# Patient Record
Sex: Female | Born: 1985 | Race: Black or African American | Hispanic: No | Marital: Single | Smoking: Never smoker
Health system: Southern US, Community
[De-identification: ages and names within clinical notes are randomized; demographics above are authoritative.]

## PROBLEM LIST (undated history)

## (undated) DIAGNOSIS — I10 Essential (primary) hypertension: Secondary | ICD-10-CM

---

## 2005-12-05 ENCOUNTER — Emergency Department (HOSPITAL_COMMUNITY): Admission: EM | Admit: 2005-12-05 | Discharge: 2005-12-06 | Payer: Self-pay | Admitting: Emergency Medicine

## 2011-10-06 ENCOUNTER — Encounter (HOSPITAL_COMMUNITY): Payer: Self-pay | Admitting: *Deleted

## 2011-10-06 ENCOUNTER — Emergency Department (HOSPITAL_COMMUNITY)
Admission: EM | Admit: 2011-10-06 | Discharge: 2011-10-07 | Disposition: A | Discharge status: Home / Self Care | Payer: Self-pay | Attending: Emergency Medicine | Admitting: Emergency Medicine

## 2011-10-06 DIAGNOSIS — M25569 Pain in unspecified knee: Secondary | ICD-10-CM | POA: Insufficient documentation

## 2011-10-06 NOTE — ED Notes (Addendum)
Pt states she feels like the bones in her knee are rubbing together x 2 days. Pt also wants blood sugar checked.

## 2011-10-07 MED ORDER — IBUPROFEN 600 MG PO TABS: 600.0000 mg | ORAL_TABLET | Freq: Three times a day (TID) | ORAL | Status: DC | PRN

## 2011-10-07 NOTE — ED Provider Notes (Signed)
History     CSN: [id number]  Arrival date & time 10/06/11  2202   First MD Initiated Contact with Patient 10/06/11 2359      Chief Complaint  Patient presents with  . Knee Pain     The history is provided by the patient.  the patient reports long-standing history of left knee pain.  She reports her pain in her left knee is becoming worse.  She reports after prolonged sitting or significant bending of her left knee she develops pain in her left knee.  She is able to ambulate on this.  She's had no recent trauma.  She denies fevers or chills.  She's had no swelling or erythema.  She denies weakness or numbness.  She previously was told that she had a meniscal injury after MRI which is unsure of shivers on orthopedic surgeon route she's not sure who ordered the MRI scan.  She was never given surgical options.  History reviewed. No pertinent past medical history.  History reviewed. No pertinent past surgical history.  History reviewed. No pertinent family history.  History  Substance Use Topics  . Smoking status: Never Smoker   . Smokeless tobacco: Not on file  . Alcohol Use: No    OB History    Grav Para Term Preterm Abortions TAB SAB Ect Mult Living                  Review of Systems  All other systems reviewed and are negative.    Allergies  Review of patient's allergies indicates no known allergies.  Home Medications   Current Outpatient Rx  Name Route Sig Dispense Refill  . IBUPROFEN 600 MG PO TABS Oral Take 1 tablet (600 mg total) by mouth every 8 (eight) hours as needed for pain. 15 tablet 0    BP 170/82  Pulse 89  Temp 98.8 F (37.1 C) (Oral)  Resp 20  Ht 5\' 5"  (1.651 m)  Wt 310 lb (140.615 kg)  BMI 51.59 kg/m2  SpO2 100%  LMP 09/16/2011  Physical Exam  Nursing note and vitals reviewed. Constitutional: She is oriented to person, place, and time. She appears well-developed and well-nourished. No distress.  HENT:  Head: Normocephalic and atraumatic.   Eyes: EOM are normal.  Neck: Normal range of motion.  Pulmonary/Chest: Effort normal.  Musculoskeletal:       Mild pain with range of motion of left knee.  Normal patella.  No joint line tenderness.  Normal pulses in her left foot.  No warmth swelling or erythema of her left knee.  Neurological: She is alert and oriented to person, place, and time.  Skin: Skin is warm and dry.  Psychiatric: She has a normal mood and affect. Judgment normal.    ED Course  Procedures (including critical care time)  Labs Reviewed - No data to display No results found.   1. Knee pain       MDM  This is likely related to an ongoing meniscal injury.  Orthopedic followup.  Symptomatic treatment.  No indication for x-ray.  No recent trauma.        Lyanne Co, MD 10/07/11 [phone]

## 2014-06-02 ENCOUNTER — Emergency Department (HOSPITAL_COMMUNITY)
Admission: EM | Admit: 2014-06-02 | Discharge: 2014-06-02 | Disposition: A | Discharge status: Home / Self Care | Payer: Self-pay | Attending: Emergency Medicine | Admitting: Emergency Medicine

## 2014-06-02 ENCOUNTER — Encounter (HOSPITAL_COMMUNITY): Payer: Self-pay | Admitting: Emergency Medicine

## 2014-06-02 ENCOUNTER — Emergency Department (HOSPITAL_COMMUNITY): Payer: Self-pay

## 2014-06-02 DIAGNOSIS — I10 Essential (primary) hypertension: Secondary | ICD-10-CM | POA: Insufficient documentation

## 2014-06-02 DIAGNOSIS — M25561 Pain in right knee: Secondary | ICD-10-CM | POA: Insufficient documentation

## 2014-06-02 HISTORY — DX: Essential (primary) hypertension: I10

## 2014-06-02 MED ORDER — MELOXICAM 15 MG PO TABS: 15.0000 mg | ORAL_TABLET | Freq: Every day | ORAL | Status: DC

## 2014-06-02 NOTE — ED Provider Notes (Signed)
CSN: [id number]     Arrival date & time 06/02/14  1047 History   This chart was scribed for non-physician practitioner, Emilia Beck, PA-C, working with Jerelyn Scott, MD, by Lionel December, ED Scribe. This patient was seen in room TR07C/TR07C and the patient's care was started at 12:36 PM.    Chief Complaint  Patient presents with  . Knee Pain     (Consider location/radiation/quality/duration/timing/severity/associated sxs/prior Treatment) The history is provided by the patient. No language interpreter was used.    HPI Comments: Natalie Reynolds is a 29 y.o. female who presents to the Emergency Department complaining of right knee pain onset yesterday when it suddenly locked up.  She currently feels like it is very stiff and cant move it easily.  Patient denies any recent injury and had a meniscal tear two years ago.  She has no other questions or concerns today.     Past Medical History  Diagnosis Date  . Hypertension    History reviewed. No pertinent past surgical history. No family history on file. History  Substance Use Topics  . Smoking status: Never Smoker   . Smokeless tobacco: Not on file  . Alcohol Use: No   OB History    No data available     Review of Systems  Constitutional: Negative for fever and chills.  Gastrointestinal: Negative for nausea and vomiting.  Musculoskeletal: Positive for arthralgias. Negative for neck pain and neck stiffness.  All other systems reviewed and are negative.     Allergies  Review of patient's allergies indicates no known allergies.  Home Medications   Prior to Admission medications   Medication Sig Start Date End Date Taking? Authorizing Provider  ibuprofen (ADVIL,MOTRIN) 600 MG tablet Take 1 tablet (600 mg total) by mouth every 8 (eight) hours as needed for pain. 10/07/11   Azalia Bilis, MD   BP 137/96 mmHg  Pulse 85  Temp(Src) 98.8 F (37.1 C) (Oral)  Resp 18  Ht  (1.651 m)  Wt 310 lb (140.615 kg)  BMI 51.59  kg/m2  SpO2 98% Physical Exam  Constitutional: She is oriented to person, place, and time. She appears well-developed and well-nourished. No distress.  HENT:  Head: Normocephalic and atraumatic.  Eyes: Conjunctivae and EOM are normal.  Neck: Neck supple.  Cardiovascular: Normal rate and regular rhythm.  Exam reveals no gallop and no friction rub.   No murmur heard. Pulmonary/Chest: Effort normal and breath sounds normal. No respiratory distress. She has no wheezes. She has no rales. She exhibits no tenderness.  Abdominal: Soft. There is no tenderness.  Musculoskeletal:  Limited flexion of right knee due to pain. Mild bilateral right knee tenderness to palpation. No obvious deformity.   Neurological: She is alert and oriented to person, place, and time.  Speech is goal-oriented. Moves limbs without ataxia.   Skin: Skin is warm and dry.  Psychiatric: She has a normal mood and affect. Her behavior is normal.  Nursing note and vitals reviewed.   ED Course  Procedures (including critical care time) DIAGNOSTIC STUDIES: Oxygen Saturation is 98% on RA, normal by my interpretation.    COORDINATION OF CARE: 12:39 PM Discussed treatment plan with patient at beside, the patient agrees with the plan and has no further questions at this time.   Labs Review Labs Reviewed - No data to display  Imaging Review Dg Knee Complete 4 Views Right  06/02/2014   CLINICAL DATA:  Medial and lateral knee pain. Unable to bear weight.  EXAM:  RIGHT KNEE - COMPLETE 4+ VIEW  COMPARISON:  None.  FINDINGS: Degenerative changes are present within the lateral and patellofemoral compartments of the knee. Joint spaces are preserved. There is no significant effusion. No acute fracture is present.  IMPRESSION: 1. Degenerative changes of the right knee without an acute abnormality.   Electronically Signed   By: Marin Robertshristopher  Mattern M.D.   On: 06/02/2014 12:20     EKG Interpretation None      MDM   Final diagnoses:   Right knee pain   Patient's knee xray unremarkable for acute changes. Vitals stable and patient afebrile. Patient will be referred to Orthopedics for further evaluation due to knee "locking up." Patient will be discharged with Mobic.    I personally performed the services described in this documentation, which was scribed in my presence. The recorded information has been reviewed and is accurate.    Emilia BeckKaitlyn Carolan Avedisian, PA-C 06/02/14 1530  Jerelyn ScottMartha Linker, MD 06/02/14 1600

## 2014-06-02 NOTE — Discharge Instructions (Signed)
Take mobic as needed for pain. Rest, ice, and elevate your knee. Follow up with Dr. Magnus IvanBlackman for further evaluation.

## 2014-06-02 NOTE — ED Notes (Signed)
Patient states knee pain that started yesterday.   Patient states had torn meniscus 2 years ago without additional problems.   Patient denies injury.

## 2015-08-11 ENCOUNTER — Emergency Department (HOSPITAL_COMMUNITY): Payer: Managed Care, Other (non HMO)

## 2015-08-11 ENCOUNTER — Encounter (HOSPITAL_COMMUNITY): Payer: Self-pay | Admitting: *Deleted

## 2015-08-11 DIAGNOSIS — I10 Essential (primary) hypertension: Secondary | ICD-10-CM | POA: Insufficient documentation

## 2015-08-11 DIAGNOSIS — Z79899 Other long term (current) drug therapy: Secondary | ICD-10-CM | POA: Diagnosis not present

## 2015-08-11 DIAGNOSIS — J069 Acute upper respiratory infection, unspecified: Secondary | ICD-10-CM | POA: Diagnosis not present

## 2015-08-11 DIAGNOSIS — R05 Cough: Secondary | ICD-10-CM | POA: Diagnosis present

## 2015-08-11 DIAGNOSIS — J209 Acute bronchitis, unspecified: Secondary | ICD-10-CM | POA: Diagnosis not present

## 2015-08-11 NOTE — ED Triage Notes (Addendum)
Pt reports cough starting about a week ago. Pt was seen for similar symptoms, prescribed antibiotics, tessalon pills, home nebulizer to help with cough. Pt is not getting any relief from medication.

## 2015-08-12 ENCOUNTER — Emergency Department (HOSPITAL_COMMUNITY)
Admission: EM | Admit: 2015-08-12 | Discharge: 2015-08-12 | Disposition: A | Discharge status: Home / Self Care | Payer: Managed Care, Other (non HMO) | Attending: Emergency Medicine | Admitting: Emergency Medicine

## 2015-08-12 DIAGNOSIS — J069 Acute upper respiratory infection, unspecified: Secondary | ICD-10-CM

## 2015-08-12 DIAGNOSIS — B9789 Other viral agents as the cause of diseases classified elsewhere: Secondary | ICD-10-CM

## 2015-08-12 DIAGNOSIS — J209 Acute bronchitis, unspecified: Secondary | ICD-10-CM

## 2015-08-12 DIAGNOSIS — R05 Cough: Secondary | ICD-10-CM

## 2015-08-12 DIAGNOSIS — R059 Cough, unspecified: Secondary | ICD-10-CM

## 2015-08-12 MED ORDER — CETIRIZINE HCL 10 MG PO TABS: 10.0000 mg | ORAL_TABLET | Freq: Every day | ORAL | 1 refills | Status: DC

## 2015-08-12 MED ORDER — PREDNISONE 20 MG PO TABS
60.0000 mg | ORAL_TABLET | Freq: Once | ORAL | Status: AC
  Administered 2015-08-12: 60 mg via ORAL
  Filled 2015-08-12: qty 3

## 2015-08-12 MED ORDER — ALBUTEROL SULFATE (2.5 MG/3ML) 0.083% IN NEBU
5.0000 mg | INHALATION_SOLUTION | Freq: Once | RESPIRATORY_TRACT | Status: AC
  Administered 2015-08-12: 5 mg via RESPIRATORY_TRACT
  Filled 2015-08-12: qty 6

## 2015-08-12 MED ORDER — HYDROCODONE-HOMATROPINE 5-1.5 MG/5ML PO SYRP
5.0000 mL | ORAL_SOLUTION | ORAL | Status: DC | PRN
  Administered 2015-08-12: 5 mL via ORAL
  Filled 2015-08-12: qty 5

## 2015-08-12 MED ORDER — ALBUTEROL SULFATE HFA 108 (90 BASE) MCG/ACT IN AERS
2.0000 | INHALATION_SPRAY | RESPIRATORY_TRACT | Status: DC | PRN
  Administered 2015-08-12: 2 via RESPIRATORY_TRACT
  Filled 2015-08-12: qty 6.7

## 2015-08-12 MED ORDER — IPRATROPIUM BROMIDE 0.02 % IN SOLN
0.5000 mg | Freq: Once | RESPIRATORY_TRACT | Status: AC
  Administered 2015-08-12: 0.5 mg via RESPIRATORY_TRACT
  Filled 2015-08-12: qty 2.5

## 2015-08-12 MED ORDER — PREDNISONE 20 MG PO TABS: 40.0000 mg | ORAL_TABLET | Freq: Every day | ORAL | 0 refills | Status: DC

## 2015-08-12 MED ORDER — ALBUTEROL SULFATE (2.5 MG/3ML) 0.083% IN NEBU: 2.5000 mg | INHALATION_SOLUTION | RESPIRATORY_TRACT | 12 refills | Status: AC | PRN

## 2015-08-12 MED ORDER — AEROCHAMBER PLUS W/MASK MISC
1.0000 | Freq: Once | Status: AC
  Administered 2015-08-12: 1
  Filled 2015-08-12: qty 1

## 2015-08-12 NOTE — ED Provider Notes (Signed)
[facility]-EMERGENCY DEPT Provider Note   CSN: [id number] Arrival date & time: 08/11/15  2246  First Provider Contact:  First MD Initiated Contact with Patient 08/12/15 0023        History   Chief Complaint Chief Complaint  Patient presents with  . Cough    HPI Natalie Reynolds is a 30 y.o. female.  Natalie Reynolds is a 30 y.o. female  with a hx of HTN presents to the Emergency Department complaining of gradual, persistent, progressively worsening cough onset 5 days ago.  Patient reports she developed nasal congestion, sore throat, bilateral otalgia on 08/03/2015. She reports that she was seen in [facility] and given cough medication along with amoxicillin which she has been taking as directed. She reports after her cough and shortness of breath developed 5 days ago she was seen at [facility] where she was given an albuterol treatment in the emergency department and discharged home.  Patient reports she was not discharged home with albuterol. She has no history of asthma. Patient reports that her cough is frustrating but her chest is tight and she feel short of breath. She denies history of DVT, leg swelling, recent car trips, or the mobilization, recent fracture, recent surgery or estrogen usage.  She denies chest pain, abdominal pain, vomiting or diarrhea. Yesterday she had some nausea. Patient denies fevers at home.  Laying flat makes symptoms worse.  Cough medication helps her cough but does not help her shortness of breath.    The history is provided by the patient, a parent and medical records. No language interpreter was used.    Past Medical History:  Diagnosis Date  . Hypertension     There are no active problems to display for this patient.   History reviewed. No pertinent surgical history.  OB History    No data available       Home Medications    Prior to Admission medications   Medication Sig Start Date End Date Taking? Authorizing Provider    amoxicillin (AMOXIL) 250 MG capsule Take 250 mg by mouth 2 (two) times daily.   Yes Historical Provider, MD  HYDROcodone-homatropine (HYCODAN) 5-1.5 MG/5ML syrup Take 5 mLs by mouth every 6 (six) hours as needed for cough.   Yes Historical Provider, MD  ibuprofen (ADVIL,MOTRIN) 600 MG tablet Take 1 tablet (600 mg total) by mouth every 8 (eight) hours as needed for pain. 10/07/11  Yes Azalia Bilis, MD  losartan-hydrochlorothiazide [facility]) 50-12.5 MG tablet Take 1 tablet by mouth daily.   Yes Historical Provider, MD  albuterol (PROVENTIL) (2.5 MG/3ML) 0.083% nebulizer solution Take 3-6 mLs (2.5-5 mg total) by nebulization every 4 (four) hours as needed for wheezing or shortness of breath. 08/12/15   Shacarra Choe, PA-C  cetirizine (ZYRTEC ALLERGY) 10 MG tablet Take 1 tablet (10 mg total) by mouth daily. 08/12/15   Chad Tiznado, PA-C  predniSONE (DELTASONE) 20 MG tablet Take 2 tablets (40 mg total) by mouth daily. 08/12/15   Dahlia Client Loralai Eisman, PA-C    Family History History reviewed. No pertinent family history.  Social History Social History  Substance Use Topics  . Smoking status: Never Smoker  . Smokeless tobacco: Not on file  . Alcohol use No     Allergies   Review of patient's allergies indicates no known allergies.   Review of Systems Review of Systems  HENT: Positive for ear pain ( Resolved), rhinorrhea ( Improved), sinus pressure ( Improved) and sore throat ( Resolved). Negative for trouble swallowing and  voice change.   Respiratory: Positive for cough and shortness of breath.   Cardiovascular: Negative for chest pain, palpitations and leg swelling.  Gastrointestinal: Positive for nausea. Negative for abdominal pain.  All other systems reviewed and are negative.    Physical Exam Updated Vital Signs BP 130/74   Pulse 99   Temp 99.8 F (37.7 C) (Oral)   Resp 18   Ht  (1.651 m)   Wt (!) 169.7 kg   SpO2 100%   BMI 62.26 kg/m   Physical Exam   Constitutional: She appears well-developed and well-nourished. No distress.  HENT:  Head: Normocephalic and atraumatic.  Right Ear: Tympanic membrane, external ear and ear canal normal.  Left Ear: Tympanic membrane, external ear and ear canal normal.  Nose: Mucosal edema and rhinorrhea present. No epistaxis. Right sinus exhibits no maxillary sinus tenderness and no frontal sinus tenderness. Left sinus exhibits no maxillary sinus tenderness and no frontal sinus tenderness.  Mouth/Throat: Uvula is midline and mucous membranes are normal. Mucous membranes are not pale and not cyanotic. No oropharyngeal exudate, posterior oropharyngeal edema, posterior oropharyngeal erythema or tonsillar abscesses.  Eyes: Conjunctivae are normal. Pupils are equal, round, and reactive to light.  Neck: Normal range of motion and full passive range of motion without pain.  Cardiovascular: Normal rate and intact distal pulses.   Pulmonary/Chest: Effort normal. No stridor. She has decreased breath sounds ( Throughout). She has no wheezes. She has no rhonchi. She has no rales.  Clear and equal breath sounds without focal wheezes, rhonchi, rales but severely diminished throughout Patient consistently coughing throughout evaluation  Abdominal: Soft. There is no tenderness.  Musculoskeletal: Normal range of motion.  Lymphadenopathy:    She has no cervical adenopathy.  Neurological: She is alert.  Skin: Skin is warm and dry. No rash noted. She is not diaphoretic.  Psychiatric: She has a normal mood and affect.  Nursing note and vitals reviewed.    ED Treatments / Results  Labs (all labs ordered are listed, but only abnormal results are displayed) Labs Reviewed - No data to display   Radiology Dg Chest 2 View  Result Date: 08/11/2015 CLINICAL DATA:  30 year old female with cough EXAM: CHEST  2 VIEW COMPARISON:  Chest radiograph dated 08/05/2015 FINDINGS: There is shallow inspiration with minimal bibasilar  atelectatic changes. No focal consolidation, pleural effusion, or pneumothorax. The cardiac silhouette is within normal limits. No acute osseous pathology. IMPRESSION: No active cardiopulmonary disease. Electronically Signed   By: Elgie Collard M.D.   On: 08/11/2015 23:33    Procedures Procedures (including critical care time)  Medications Ordered in ED Medications  HYDROcodone-homatropine (HYCODAN) 5-1.5 MG/5ML syrup 5 mL (5 mLs Oral Given 08/12/15 0122)  albuterol (PROVENTIL HFA;VENTOLIN HFA) 108 (90 Base) MCG/ACT inhaler 2 puff (not administered)  aerochamber plus with mask device 1 each (not administered)  albuterol (PROVENTIL) (2.5 MG/3ML) 0.083% nebulizer solution 5 mg (5 mg Nebulization Given 08/12/15 0123)  ipratropium (ATROVENT) nebulizer solution 0.5 mg (0.5 mg Nebulization Given 08/12/15 0123)  predniSONE (DELTASONE) tablet 60 mg (60 mg Oral Given 08/12/15 0122)  albuterol (PROVENTIL) (2.5 MG/3ML) 0.083% nebulizer solution 5 mg (5 mg Nebulization Given 08/12/15 0224)  ipratropium (ATROVENT) nebulizer solution 0.5 mg (0.5 mg Nebulization Given 08/12/15 0224)     Initial Impression / Assessment and Plan / ED Course  I have reviewed the triage vital signs and the nursing notes.  Pertinent labs & imaging results that were available during my care of the patient were  reviewed by me and considered in my medical decision making (see chart for details).  Clinical Course  Value Comment By Time  DG Chest 2 View No PNA, pulmonary edema or pneumothorax Dierdre Forth, PA-C 08/05 0051  Pulse Rate: 113 Tachycardia, likely due to persistent coughing and URI; Pt denies PE risk factors. Dierdre Forth, PA-C 08/05 [phone]   Patient reports she is feeling much better after albuterol treatment.  Her shortness of breath is significantly improved. She has increased tidal volume.   Dahlia Client Nataya Bastedo, PA-C 08/05 0220   Pt with continued improvement after 2nd albuterol.  Wishes for d/c home.   Dahlia Client  Shawndale Kilpatrick, PA-C 08/05 0243   Pt With cough and shortness of breath. Has been seen several times for this. Exam is consistent with viral URI. Suspicious for bronchitis. Chest x-ray with no evidence of pneumonia. Patient given albuterol 2 with significant improvement and resolution of shortness of breath. She reports breathing at baseline at this time. No hypoxia throughout her time here in the emergency department. Patient has been tachycardic however I suspect this is secondary to her persistent coughing and then albuterol usage. Wells criteria 0. Patient as well as risk for DVT/PE and has no clinical findings of DVT. Highly doubt PE.    Final Clinical Impressions(s) / ED Diagnoses   Final diagnoses:  Cough  Viral URI with cough  Acute bronchitis, unspecified organism    New Prescriptions New Prescriptions   ALBUTEROL (PROVENTIL) (2.5 MG/3ML) 0.083% NEBULIZER SOLUTION    Take 3-6 mLs (2.5-5 mg total) by nebulization every 4 (four) hours as needed for wheezing or shortness of breath.   CETIRIZINE (ZYRTEC ALLERGY) 10 MG TABLET    Take 1 tablet (10 mg total) by mouth daily.   PREDNISONE (DELTASONE) 20 MG TABLET    Take 2 tablets (40 mg total) by mouth daily.     Dahlia Client Brentin Shin, PA-C 08/12/15 0255    Azalia Bilis, MD 08/12/15 [phone]

## 2015-08-12 NOTE — Discharge Instructions (Signed)
1. Medications: albuterol, prednisone, usual home medications including Hycodan for cough 2. Treatment: rest, drink plenty of fluids, begin OTC antihistamine (Zyrtec or Claritin)  3. Follow Up: Please followup with your primary doctor in 2-3 days for discussion of your diagnoses and further evaluation after today's visit; if you do not have a primary care doctor use the resource guide provided to find one; Please return to the ER for difficulty breathing, high fevers or worsening symptoms.

## 2015-12-28 ENCOUNTER — Encounter (HOSPITAL_COMMUNITY): Payer: Self-pay | Admitting: Emergency Medicine

## 2015-12-28 ENCOUNTER — Emergency Department (HOSPITAL_COMMUNITY): Payer: Self-pay

## 2015-12-28 ENCOUNTER — Emergency Department (HOSPITAL_COMMUNITY)
Admission: EM | Admit: 2015-12-28 | Discharge: 2015-12-28 | Disposition: A | Discharge status: Home / Self Care | Payer: Self-pay | Attending: Emergency Medicine | Admitting: Emergency Medicine

## 2015-12-28 DIAGNOSIS — J029 Acute pharyngitis, unspecified: Secondary | ICD-10-CM | POA: Insufficient documentation

## 2015-12-28 DIAGNOSIS — Z79899 Other long term (current) drug therapy: Secondary | ICD-10-CM | POA: Insufficient documentation

## 2015-12-28 DIAGNOSIS — I10 Essential (primary) hypertension: Secondary | ICD-10-CM | POA: Insufficient documentation

## 2015-12-28 LAB — CBC WITH DIFFERENTIAL/PLATELET
Basophils Absolute: 0 10*3/uL (ref 0.0–0.1)
Basophils Relative: 0 %
Eosinophils Absolute: 0.2 10*3/uL (ref 0.0–0.7)
Eosinophils Relative: 3 %
HCT: 38.7 % (ref 36.0–46.0)
HEMOGLOBIN: 12.1 g/dL (ref 12.0–15.0)
LYMPHS ABS: 2.7 10*3/uL (ref 0.7–4.0)
LYMPHS PCT: 40 %
MCH: 26.1 pg (ref 26.0–34.0)
MCHC: 31.3 g/dL (ref 30.0–36.0)
MCV: 83.4 fL (ref 78.0–100.0)
Monocytes Absolute: 0.6 10*3/uL (ref 0.1–1.0)
Monocytes Relative: 9 %
NEUTROS PCT: 48 %
Neutro Abs: 3.2 10*3/uL (ref 1.7–7.7)
Platelets: 303 10*3/uL (ref 150–400)
RBC: 4.64 MIL/uL (ref 3.87–5.11)
RDW: 13.4 % (ref 11.5–15.5)
WBC: 6.7 10*3/uL (ref 4.0–10.5)

## 2015-12-28 LAB — COMPREHENSIVE METABOLIC PANEL
ALK PHOS: 55 U/L (ref 38–126)
ALT: 15 U/L (ref 14–54)
AST: 16 U/L (ref 15–41)
Albumin: 3.5 g/dL (ref 3.5–5.0)
Anion gap: 5 (ref 5–15)
BUN: 14 mg/dL (ref 6–20)
CALCIUM: 8.6 mg/dL — AB (ref 8.9–10.3)
CO2: 26 mmol/L (ref 22–32)
CREATININE: 0.76 mg/dL (ref 0.44–1.00)
Chloride: 105 mmol/L (ref 101–111)
Glucose, Bld: 95 mg/dL (ref 65–99)
Potassium: 4 mmol/L (ref 3.5–5.1)
SODIUM: 136 mmol/L (ref 135–145)
Total Bilirubin: 0.5 mg/dL (ref 0.3–1.2)
Total Protein: 6.8 g/dL (ref 6.5–8.1)

## 2015-12-28 MED ORDER — PENICILLIN V POTASSIUM 500 MG PO TABS
500.0000 mg | ORAL_TABLET | Freq: Four times a day (QID) | ORAL | 0 refills | Status: AC
Start: 2015-12-28 — End: 2016-01-04

## 2015-12-28 MED ORDER — IOPAMIDOL (ISOVUE-300) INJECTION 61%
75.0000 mL | Freq: Once | INTRAVENOUS | Status: AC | PRN
  Administered 2015-12-28: 75 mL via INTRAVENOUS

## 2015-12-28 NOTE — ED Provider Notes (Signed)
[facility] DEPT Provider Note   CSN: [id number] Arrival date & time: 12/28/15  1324     History   Chief Complaint Chief Complaint  Patient presents with  . Sore Throat    HPI Natalie Reynolds is a 30 y.o. female.  Patient complains of sore throat and swelling under her neck   The history is provided by the patient. No language interpreter was used.  Sore Throat  This is a new problem. The current episode started 2 days ago. The problem occurs constantly. The problem has not changed since onset.Pertinent negatives include no chest pain, no abdominal pain and no headaches. Nothing aggravates the symptoms. Nothing relieves the symptoms.    Past Medical History:  Diagnosis Date  . Hypertension     There are no active problems to display for this patient.   History reviewed. No pertinent surgical history.  OB History    No data available       Home Medications    Prior to Admission medications   Medication Sig Start Date End Date Taking? Authorizing Provider  albuterol (PROVENTIL) (2.5 MG/3ML) 0.083% nebulizer solution Take 3-6 mLs (2.5-5 mg total) by nebulization every 4 (four) hours as needed for wheezing or shortness of breath. 08/12/15   Hannah Muthersbaugh, PA-C  amoxicillin (AMOXIL) 250 MG capsule Take 250 mg by mouth 2 (two) times daily.    Historical Provider, MD  cetirizine (ZYRTEC ALLERGY) 10 MG tablet Take 1 tablet (10 mg total) by mouth daily. 08/12/15   Hannah Muthersbaugh, PA-C  HYDROcodone-homatropine (HYCODAN) 5-1.5 MG/5ML syrup Take 5 mLs by mouth every 6 (six) hours as needed for cough.    Historical Provider, MD  ibuprofen (ADVIL,MOTRIN) 600 MG tablet Take 1 tablet (600 mg total) by mouth every 8 (eight) hours as needed for pain. 10/07/11   Azalia Bilis, MD  losartan-hydrochlorothiazide (HYZAAR) 50-12.5 MG tablet Take 1 tablet by mouth daily.    Historical Provider, MD  penicillin v potassium (VEETID) 500 MG tablet Take 1 tablet (500 mg total) by mouth 4  (four) times daily. 12/28/15 01/04/16  Bethann Berkshire, MD  predniSONE (DELTASONE) 20 MG tablet Take 2 tablets (40 mg total) by mouth daily. 08/12/15   Dahlia Client Muthersbaugh, PA-C    Family History History reviewed. No pertinent family history.  Social History Social History  Substance Use Topics  . Smoking status: Never Smoker  . Smokeless tobacco: Never Used  . Alcohol use No     Allergies   Patient has no known allergies.   Review of Systems Review of Systems  Constitutional: Negative for appetite change and fatigue.  HENT: Positive for sore throat. Negative for congestion, ear discharge and sinus pressure.   Eyes: Negative for discharge.  Respiratory: Negative for cough.   Cardiovascular: Negative for chest pain.  Gastrointestinal: Negative for abdominal pain and diarrhea.  Genitourinary: Negative for frequency and hematuria.  Musculoskeletal: Negative for back pain.  Skin: Negative for rash.  Neurological: Negative for seizures and headaches.  Psychiatric/Behavioral: Negative for hallucinations.     Physical Exam Updated Vital Signs BP 145/84 (BP Location: Right Wrist)   Pulse 80   Temp 97.8 F (36.6 C) (Oral)   Resp 18   Ht 5\' 5"  (1.651 m)   Wt (!) 366 lb (166 kg)   LMP 12/19/2015   SpO2 96%   BMI 60.91 kg/m   Physical Exam  Constitutional: She is oriented to person, place, and time. She appears well-developed.  HENT:  Head: Normocephalic.  Ferrous mildly  inflamed. Patient has swelling under her chin on the right side which is tender. Possibly related lymph node  Eyes: Conjunctivae are normal.  Neck: No tracheal deviation present.  Cardiovascular:  No murmur heard. Musculoskeletal: Normal range of motion.  Neurological: She is oriented to person, place, and time.  Skin: Skin is warm.  Psychiatric: She has a normal mood and affect.     ED Treatments / Results  Labs (all labs ordered are listed, but only abnormal results are displayed) Labs Reviewed    COMPREHENSIVE METABOLIC PANEL - Abnormal; Notable for the following:       Result Value   Calcium 8.6 (*)    All other components within normal limits  CBC WITH DIFFERENTIAL/PLATELET    EKG  EKG Interpretation None       Radiology Ct Soft Tissue Neck W Contrast  Result Date: 12/28/2015 CLINICAL DATA:  30 year old hypertensive female with sore throat and right ear pain for 2 weeks. Difficulty swallowing. Initial encounter. EXAM: CT NECK WITH CONTRAST TECHNIQUE: Multidetector CT imaging of the neck was performed using the standard protocol following the bolus administration of intravenous contrast. CONTRAST:  75mL ISOVUE-300 IOPAMIDOL (ISOVUE-300) INJECTION 61% COMPARISON:  No comparison neck CT.  Prior head CT 11/04/2008. FINDINGS: Pharynx and larynx: Diffuse enlargement of lymphoid tissue of Waldeyer's ring including bilateral palatine tonsil enlargement narrowing of the air column suggestive of inflammation without drainable abscess or parapharyngeal extension of inflammatory process. Inflammation extends to the glottic region. The tip of the epiglottis does not appear enlarged. Salivary glands: No primary parotid or salivary gland abnormality. Thyroid: Evaluation limited by patient's habitus and artifact. Lymph nodes: Adenopathy most notable level 2 region bilaterally probably reactive in origin. Vascular: Medially displaced carotid arteries and internal jugular veins. Evaluation limited by patient's habitus. Limited intracranial: Partially empty slightly expanded sella without secondary findings of pseudotumor. Visualized orbits: Portions of the orbits visualized suggest exophthalmos. Mastoids and visualized paranasal sinuses: Mastoid air cells and middle ear cavities are clear. Minimal mucosal thickening inferior right maxillary sinus. Skeleton: Evaluation of cervical spine region limited by patient's habitus. No obvious osseous abnormality. Upper chest: Visualized upper lungs clear. Other:  Negative IMPRESSION: Evaluation limited by patient's habitus and subsequent artifact. Diffuse enlargement of lymphoid tissue of Waldeyer's ring including bilateral palatine tonsil enlargement narrowing of the air column suggestive of inflammation without drainable abscess or parapharyngeal extension of inflammatory process. Inflammation extends to the glottic region. The tip of the epiglottis does not appear enlarged. Adenopathy most notable level 2 region bilaterally probably reactive in origin. Partially empty slightly expanded sella without secondary findings of pseudotumor. Mastoid air cells and middle ear cavities are clear. Minimal mucosal thickening inferior right maxillary sinus. Electronically Signed   By: Lacy DuverneySteven  Olson M.D.   On: 12/28/2015 09:33    Procedures Procedures (including critical care time)  Medications Ordered in ED Medications  iopamidol (ISOVUE-300) 61 % injection 75 mL (75 mLs Intravenous Contrast Given 12/28/15 0850)     Initial Impression / Assessment and Plan / ED Course  I have reviewed the triage vital signs and the nursing notes.  Pertinent labs & imaging results that were available during my care of the patient were reviewed by me and considered in my medical decision making (see chart for details).  Clinical Course     CT scan is consistent with pharyngitis. Patient will be put on penicillin will follow-up with family doctor as needed  Final Clinical Impressions(s) / ED Diagnoses   Final diagnoses:  Sore throat    New Prescriptions New Prescriptions   PENICILLIN V POTASSIUM (VEETID) 500 MG TABLET    Take 1 tablet (500 mg total) by mouth 4 (four) times daily.     Bethann BerkshireJoseph Magic Mohler, MD 12/28/15 1032

## 2015-12-28 NOTE — Discharge Instructions (Signed)
Take Tylenol or Motrin for pain or swelling. Drink plenty of fluids. Follow-up with Dr.Fagan if not improving.

## 2015-12-28 NOTE — ED Triage Notes (Signed)
Patient complaining of sore throat and right ear pain x 2 weeks.

## 2015-12-28 NOTE — ED Notes (Signed)
Pt made aware to return if symptoms worsen or if any life threatening symptoms occur.   

## 2016-04-07 ENCOUNTER — Emergency Department (HOSPITAL_COMMUNITY)
Admission: EM | Admit: 2016-04-07 | Discharge: 2016-04-07 | Disposition: A | Discharge status: Home / Self Care | Payer: PRIVATE HEALTH INSURANCE | Attending: Emergency Medicine | Admitting: Emergency Medicine

## 2016-04-07 ENCOUNTER — Encounter (HOSPITAL_COMMUNITY): Payer: Self-pay | Admitting: *Deleted

## 2016-04-07 DIAGNOSIS — I10 Essential (primary) hypertension: Secondary | ICD-10-CM | POA: Insufficient documentation

## 2016-04-07 DIAGNOSIS — Z79899 Other long term (current) drug therapy: Secondary | ICD-10-CM | POA: Diagnosis not present

## 2016-04-07 DIAGNOSIS — G44209 Tension-type headache, unspecified, not intractable: Secondary | ICD-10-CM | POA: Diagnosis not present

## 2016-04-07 DIAGNOSIS — Z791 Long term (current) use of non-steroidal anti-inflammatories (NSAID): Secondary | ICD-10-CM | POA: Insufficient documentation

## 2016-04-07 DIAGNOSIS — R51 Headache: Secondary | ICD-10-CM | POA: Diagnosis present

## 2016-04-07 MED ORDER — LOSARTAN POTASSIUM-HCTZ 50-12.5 MG PO TABS: 1.0000 | ORAL_TABLET | Freq: Every day | ORAL | 0 refills | Status: DC

## 2016-04-07 NOTE — ED Triage Notes (Addendum)
Pt reports headache x 2 days. Pt reports sharp pains from behind her left ear that is going up in her head. Pt reports her chest started feeling funny about 1 hr ago. Pt states her main complaint is her head.

## 2016-04-07 NOTE — ED Notes (Addendum)
EKG NEEDS TO REPEATED IN THE ROOM IN THE BACK PER DR. Ethelda Chick.

## 2016-04-07 NOTE — Discharge Instructions (Signed)
Call the number attached, to help find a primary care doctor.  Follow-up with a primary care doctor as soon as possible to establish care.  We are increasing your dose of blood pressure medicine to a full pill, once a day.  Try using a heating pad on the sore area of your neck 3 or 4 times a day, and take Tylenol or Motrin for pain.

## 2016-04-07 NOTE — ED Notes (Signed)
PLEASE NOTE THAT THE VITAL SIGNS ENTERED ARE NOT THIS PATIENTS. UNABLE TO REMOVE OTHER VITALS.

## 2016-04-07 NOTE — ED Provider Notes (Signed)
[facility] Provider Note   CSN: [id number] Arrival date & time: 04/07/16  2011   By signing my name below, I, Natalie Reynolds, attest that this documentation has been prepared under the direction and in the presence of Natalie Bale, MD. Electronically Signed: Bobbie Reynolds, Scribe. 04/07/16. 9:32 PM. History   Chief Complaint Chief Complaint  Patient presents with  . Headache    The history is provided by the patient. No language interpreter was used.  HPI Comments: Natalie Reynolds is a 31 y.o. female with a hx of HTN, who presents to the Emergency Department complaining of headache for the past 2 days. The patient reports shooting pain behind her left ear that radiates to her head and a "flutter" feeling in her chest. The patient also reports hair loss on the left side of her head. She has been taking her lisinopril with no relief. She hasn't tried taking anything for her pain. She reports having headaches in the past. She denies any near-syncope episodes, fevers, and SOB. The patient states that she takes half of her lisinopril (50 mg) daily per doctors orders.  Past Medical History:  Diagnosis Date  . Hypertension     There are no active problems to display for this patient.   History reviewed. No pertinent surgical history.  OB History    No data available       Home Medications    Prior to Admission medications   Medication Sig Start Date End Date Taking? Authorizing Provider  albuterol (PROVENTIL) (2.5 MG/3ML) 0.083% nebulizer solution Take 3-6 mLs (2.5-5 mg total) by nebulization every 4 (four) hours as needed for wheezing or shortness of breath. 08/12/15   Natalie Muthersbaugh, PA-C  amoxicillin (AMOXIL) 250 MG capsule Take 250 mg by mouth 2 (two) times daily.    Historical Provider, MD  cetirizine (ZYRTEC ALLERGY) 10 MG tablet Take 1 tablet (10 mg total) by mouth daily. 08/12/15   Natalie Muthersbaugh, PA-C  HYDROcodone-homatropine (HYCODAN) 5-1.5 MG/5ML syrup  Take 5 mLs by mouth every 6 (six) hours as needed for cough.    Historical Provider, MD  ibuprofen (ADVIL,MOTRIN) 600 MG tablet Take 1 tablet (600 mg total) by mouth every 8 (eight) hours as needed for pain. 10/07/11   Natalie Bilis, MD  losartan-hydrochlorothiazide (HYZAAR) 50-12.5 MG tablet Take 1 tablet by mouth daily. 04/07/16   Natalie Bale, MD  predniSONE (DELTASONE) 20 MG tablet Take 2 tablets (40 mg total) by mouth daily. 08/12/15   Natalie Client Muthersbaugh, PA-C    Family History History reviewed. No pertinent family history.  Social History Social History  Substance Use Topics  . Smoking status: Never Smoker  . Smokeless tobacco: Never Used  . Alcohol use No     Allergies   Patient has no known allergies.   Review of Systems Review of Systems  Constitutional: Negative for fever.  Respiratory: Negative for shortness of breath.   Cardiovascular: Positive for palpitations.  Gastrointestinal: Negative for abdominal pain.  Neurological: Positive for headaches. Negative for syncope.  All other systems reviewed and are negative.    Physical Exam Updated Vital Signs BP (!) 151/93   Pulse 90   Temp 99.1 F (37.3 C) (Oral)   Resp 16   Ht  (1.651 m)   Wt (!) 375 lb (170.1 kg)   LMP 04/06/2016   SpO2 99%   BMI 62.40 kg/m   Physical Exam  Constitutional: She is oriented to person, place, and time. She appears well-developed and well-nourished.  HENT:  Head: Normocephalic and atraumatic.  Eyes: Conjunctivae and EOM are normal. Pupils are equal, round, and reactive to light.  Neck: Normal range of motion and phonation normal. Neck supple.  Cardiovascular: Normal rate and regular rhythm.   Pulmonary/Chest: Effort normal and breath sounds normal. She exhibits no tenderness.  Abdominal: Soft. She exhibits no distension. There is no tenderness. There is no guarding.  Musculoskeletal: Normal range of motion. She exhibits edema and tenderness.  Tenderness in left lateral  cervical musculature. 2+ lower extremity edema.  Neurological: She is alert and oriented to person, place, and time. She exhibits normal muscle tone.  No pronator drift.  Skin: Skin is warm and dry.  Psychiatric: She has a normal mood and affect. Her behavior is normal. Judgment and thought content normal.  Nursing note and vitals reviewed.    ED Treatments / Results  DIAGNOSTIC STUDIES: Oxygen Saturation is 99% on RA, normal by my interpretation.    COORDINATION OF CARE: 9:21 PM Discussed treatment plan with pt at bedside and pt agreed to plan. I will check the patient's labs.  Labs (all labs ordered are listed, but only abnormal results are displayed) Labs Reviewed - No data to display  EKG  EKG Interpretation  Date/Time:  Sunday April 07 2016 20:45:05 EDT Ventricular Rate:  89 PR Interval:  166 QRS Duration: 72 QT Interval:  349 QTC Calculation: 425 R Axis:   96 Text Interpretation:  Sinus rhythm Borderline right axis deviation Low voltage, precordial leads Minimal ST depression Since last tracing of earlier today No significant change was found Confirmed by Natalie Shy  MD, Natalie Reynolds ([id number]) on 04/07/2016 9:18:51 PM       Radiology No results found.  Procedures Procedures (including critical care time)  Medications Ordered in ED Medications - No data to display   Initial Impression / Assessment and Plan / ED Course  I have reviewed the triage vital signs and the nursing notes.  Pertinent labs & imaging results that were available during my care of the patient were reviewed by me and considered in my medical decision making (see chart for details).     Medications - No data to display  Patient Vitals for the past 24 hrs:  BP Temp Temp src Pulse Resp SpO2 Height Weight  04/07/16 2153 (!) 151/93 - - 90 16 99 % - -  04/07/16 2029 (!) 175/71 99.1 F (37.3 C) Oral 84 18 99 % - -  04/07/16 2022 128/84 100.2 F (37.9 C) Oral 85 17 97 % - -  04/07/16 2018 - - - - - - 5'  5" (1.651 m) (!) 375 lb (170.1 kg)    At D/C- Reevaluation with update and discussion. After initial assessment and treatment, an updated evaluation reveals she is comfortable. No further c/o. Findings discusssed. Natalie Reynolds    Final Clinical Impressions(s) / ED Diagnoses   Final diagnoses:  Acute non intractable tension-type headache  Hypertension, unspecified type    Suspect tension HA, muscle spasm neck. She is morbidly obese. Increased BP on low-dose BP med. Will increase med to typical dosing.  Nursing Notes Reviewed/ Care Coordinated Applicable Imaging Reviewed Interpretation of Laboratory Data incorporated into ED treatment  The patient appears reasonably screened and/or stabilized for discharge and I doubt any other medical condition or other [facility] requiring further screening, evaluation, or treatment in the ED at this time prior to discharge.  Plan: Home Medications- continue usual, APAP for pain; Home Treatments- rest, heat; return here if  the recommended treatment, does not improve the symptoms; Recommended follow up- PCP 1 week.   New Prescriptions Discharge Medication List as of 04/07/2016  9:39 PM     I personally performed the services described in this documentation, which was scribed in my presence. The recorded information has been reviewed and is accurate.      Natalie Bale, MD 04/08/16 1011

## 2016-04-29 ENCOUNTER — Emergency Department (HOSPITAL_COMMUNITY)
Admission: EM | Admit: 2016-04-29 | Discharge: 2016-04-29 | Disposition: A | Discharge status: Home / Self Care | Payer: PRIVATE HEALTH INSURANCE | Attending: Emergency Medicine | Admitting: Emergency Medicine

## 2016-04-29 ENCOUNTER — Encounter (HOSPITAL_COMMUNITY): Payer: Self-pay | Admitting: Emergency Medicine

## 2016-04-29 DIAGNOSIS — Z79899 Other long term (current) drug therapy: Secondary | ICD-10-CM | POA: Insufficient documentation

## 2016-04-29 DIAGNOSIS — X58XXXA Exposure to other specified factors, initial encounter: Secondary | ICD-10-CM | POA: Insufficient documentation

## 2016-04-29 DIAGNOSIS — S39012A Strain of muscle, fascia and tendon of lower back, initial encounter: Secondary | ICD-10-CM | POA: Diagnosis not present

## 2016-04-29 DIAGNOSIS — Y939 Activity, unspecified: Secondary | ICD-10-CM | POA: Insufficient documentation

## 2016-04-29 DIAGNOSIS — R350 Frequency of micturition: Secondary | ICD-10-CM | POA: Insufficient documentation

## 2016-04-29 DIAGNOSIS — I10 Essential (primary) hypertension: Secondary | ICD-10-CM | POA: Diagnosis not present

## 2016-04-29 DIAGNOSIS — Y929 Unspecified place or not applicable: Secondary | ICD-10-CM | POA: Insufficient documentation

## 2016-04-29 DIAGNOSIS — M545 Low back pain, unspecified: Secondary | ICD-10-CM

## 2016-04-29 DIAGNOSIS — S3992XA Unspecified injury of lower back, initial encounter: Secondary | ICD-10-CM | POA: Diagnosis present

## 2016-04-29 DIAGNOSIS — Y999 Unspecified external cause status: Secondary | ICD-10-CM | POA: Insufficient documentation

## 2016-04-29 LAB — URINALYSIS, ROUTINE W REFLEX MICROSCOPIC
BILIRUBIN URINE: NEGATIVE
Glucose, UA: NEGATIVE mg/dL
HGB URINE DIPSTICK: NEGATIVE
KETONES UR: NEGATIVE mg/dL
Leukocytes, UA: NEGATIVE
Nitrite: NEGATIVE
PROTEIN: NEGATIVE mg/dL
Specific Gravity, Urine: 1.024 (ref 1.005–1.030)
pH: 5 (ref 5.0–8.0)

## 2016-04-29 LAB — PREGNANCY, URINE: PREG TEST UR: NEGATIVE

## 2016-04-29 MED ORDER — IBUPROFEN 400 MG PO TABS
400.0000 mg | ORAL_TABLET | Freq: Once | ORAL | Status: AC
  Administered 2016-04-29: 400 mg via ORAL
  Filled 2016-04-29: qty 1

## 2016-04-29 MED ORDER — METHOCARBAMOL 500 MG PO TABS: 500.0000 mg | ORAL_TABLET | Freq: Two times a day (BID) | ORAL | 0 refills | Status: DC

## 2016-04-29 MED ORDER — IBUPROFEN 600 MG PO TABS
600.0000 mg | ORAL_TABLET | Freq: Four times a day (QID) | ORAL | 0 refills | Status: AC | PRN
Start: 2016-04-29 — End: ?

## 2016-04-29 NOTE — Discharge Instructions (Signed)
Please read and follow all provided instructions.  Your diagnoses today include:  1. Acute right-sided low back pain without sciatica   2. Strain of lumbar region, initial encounter     Tests performed today include: Vital signs - see below for your results today  Medications prescribed:   Take any prescribed medications only as directed.  Home care instructions:  Follow any educational materials contained in this packet Please rest, use ice or heat on your back for the next several days Do not lift, push, pull anything more than 10 pounds for the next week  Follow-up instructions: Please follow-up with your primary care provider in the next 1 week for further evaluation of your symptoms.   Return instructions:  SEEK IMMEDIATE MEDICAL ATTENTION IF YOU HAVE: New numbness, tingling, weakness, or problem with the use of your arms or legs Severe back pain not relieved with medications Loss control of your bowels or bladder Increasing pain in any areas of the body (such as chest or abdominal pain) Shortness of breath, dizziness, or fainting.  Worsening nausea (feeling sick to your stomach), vomiting, fever, or sweats Any other emergent concerns regarding your health   Additional Information:  Your vital signs today were: BP (!) 145/60 (BP Location: Right Arm)    Pulse 84    Temp 99 F (37.2 C) (Oral)    Resp 18    Ht  (1.651 m)    Wt (!) 170.1 kg    LMP 04/12/2016    SpO2 100%    BMI 62.40 kg/m  If your blood pressure (BP) was elevated above 135/85 this visit, please have this repeated by your doctor within one month. --------------

## 2016-04-29 NOTE — ED Notes (Signed)
Pt made aware to return if symptoms worsen or if any life threatening symptoms occur.   

## 2016-04-29 NOTE — ED Provider Notes (Signed)
[facility]-EMERGENCY DEPT Provider Note   CSN: [id number] Arrival date & time: 04/29/16  1120  By signing my name below, I, Cynda Acres, attest that this documentation has been prepared under the direction and in the presence of Audry Pili, PA-C. Electronically Signed: Cynda Acres, Scribe. 04/29/16. 12:13 PM.  History   Chief Complaint Chief Complaint  Patient presents with  . Urinary Frequency    HPI Comments: Natalie Reynolds is a 31 y.o. female with a history of hypertension, who presents to the Emergency Department complaining of sudden-onset, constant right-sided back pain that began yesterday. Patient states she developed right sided back pain. Patient reports being placed on an increased dose of HCTZ recently. Patient reports associated urinary frequency. No modifying factors indicated. Patient denies any dysuria, vaginal discharge, fever, abdominal pain, lower extremity numbness, weakness, bladder/bowll incontinence.    The history is provided by the patient. No language interpreter was used.    Past Medical History:  Diagnosis Date  . Hypertension     There are no active problems to display for this patient.   History reviewed. No pertinent surgical history.  OB History    No data available       Home Medications    Prior to Admission medications   Medication Sig Start Date End Date Taking? Authorizing Provider  albuterol (PROVENTIL) (2.5 MG/3ML) 0.083% nebulizer solution Take 3-6 mLs (2.5-5 mg total) by nebulization every 4 (four) hours as needed for wheezing or shortness of breath. 08/12/15   Hannah Muthersbaugh, PA-C  amoxicillin (AMOXIL) 250 MG capsule Take 250 mg by mouth 2 (two) times daily.    Historical Provider, MD  cetirizine (ZYRTEC ALLERGY) 10 MG tablet Take 1 tablet (10 mg total) by mouth daily. 08/12/15   Hannah Muthersbaugh, PA-C  HYDROcodone-homatropine (HYCODAN) 5-1.5 MG/5ML syrup Take 5 mLs by mouth every 6 (six) hours as needed for cough.    Historical  Provider, MD  ibuprofen (ADVIL,MOTRIN) 600 MG tablet Take 1 tablet (600 mg total) by mouth every 8 (eight) hours as needed for pain. 10/07/11   Azalia Bilis, MD  losartan-hydrochlorothiazide (HYZAAR) 50-12.5 MG tablet Take 1 tablet by mouth daily. 04/07/16   Mancel Bale, MD  predniSONE (DELTASONE) 20 MG tablet Take 2 tablets (40 mg total) by mouth daily. 08/12/15   Dahlia Client Muthersbaugh, PA-C    Family History History reviewed. No pertinent family history.  Social History Social History  Substance Use Topics  . Smoking status: Never Smoker  . Smokeless tobacco: Never Used  . Alcohol use No     Allergies   Patient has no known allergies.   Review of Systems Review of Systems  Constitutional: Negative for fever.  Gastrointestinal: Negative for abdominal pain.  Genitourinary: Positive for flank pain (bilateral ) and frequency. Negative for dysuria and vaginal discharge.  Musculoskeletal: Positive for back pain (right sided ).  Neurological: Negative for weakness and numbness.   Physical Exam Updated Vital Signs BP (!) 145/60 (BP Location: Right Arm)   Pulse 84   Temp 99 F (37.2 C) (Oral)   Resp 18   Ht  (1.651 m)   Wt (!) 375 lb (170.1 kg)   LMP 04/12/2016   SpO2 100%   BMI 62.40 kg/m   Physical Exam  Constitutional: She is oriented to person, place, and time. Vital signs are normal. She appears well-developed and well-nourished.  HENT:  Head: Normocephalic and atraumatic.  Right Ear: Hearing normal.  Left Ear: Hearing normal.  Mouth/Throat: Oropharynx is clear and  moist.  Eyes: Conjunctivae and EOM are normal. Pupils are equal, round, and reactive to light.  Neck: Normal range of motion. Neck supple.  Cardiovascular: Normal rate, regular rhythm, normal heart sounds and intact distal pulses.   Pulmonary/Chest: Effort normal and breath sounds normal.  Abdominal: Bowel sounds are normal. There is no tenderness. There is no rigidity, no rebound, no guarding, no CVA  tenderness, no tenderness at McBurney's point and negative Murphy's sign.  Musculoskeletal: Normal range of motion. She exhibits tenderness. She exhibits no edema or deformity.  Tenderness to the right lumbar musculature. No midline tenderness.   Neurological: She is alert and oriented to person, place, and time.  Skin: Skin is warm and dry.  Psychiatric: She has a normal mood and affect. Her speech is normal and behavior is normal. Thought content normal.  Nursing note and vitals reviewed.  ED Treatments / Results  DIAGNOSTIC STUDIES: Oxygen Saturation is 100% on RA, normal by my interpretation.    COORDINATION OF CARE: 12:12 PM Discussed treatment plan with pt at bedside and pt agreed to plan, which includes a urinalysis and antibiotics.   Labs (all labs ordered are listed, but only abnormal results are displayed) Labs Reviewed  URINALYSIS, ROUTINE W REFLEX MICROSCOPIC  PREGNANCY, URINE    EKG  EKG Interpretation None       Radiology No results found.  Procedures Procedures (including critical care time)  Medications Ordered in ED Medications - No data to display   Initial Impression / Assessment and Plan / ED Course  I have reviewed the triage vital signs and the nursing notes.  Pertinent labs & imaging results that were available during my care of the patient were reviewed by me and considered in my medical decision making (see chart for details).  Final Clinical Impressions(s) / ED Diagnoses   I have reviewed the relevant previous healthcare records. I obtained HPI from historian.  ED Course:  Assessment: Patient is a 31 y.o. female who presents to the ED with back pain yesterday. Palpable. No fever. No Dysuria. Noted increased frequency, but pt recently increased HCTZ dosing. No neurological deficits appreciated. Patient is ambulatory. No warning symptoms of back pain including: fecal incontinence, urinary retention or overflow incontinence, night sweats,  waking from sleep with back pain, unexplained fevers or weight loss, h/o cancer, IVDU, recent trauma. No concern for cauda equina, epidural abscess, or other serious cause of back pain. Conservative measures such as rest, ice/heat and pain medicine indicated with PCP follow-up if no improvement with conservative management.   Disposition/Plan:  DC Home Additional Verbal discharge instructions given and discussed with patient.  Pt Instructed to f/u with PCP in the next week for evaluation and treatment of symptoms. Return precautions given Pt acknowledges and agrees with plan  Supervising Physician Marily Memos, MD  Final diagnoses:  Acute right-sided low back pain without sciatica  Strain of lumbar region, initial encounter    New Prescriptions New Prescriptions   No medications on file   I personally performed the services described in this documentation, which was scribed in my presence. The recorded information has been reviewed and is accurate.    Audry Pili, PA-C 04/29/16 1219    Marily Memos, MD 04/29/16 [phone]

## 2016-04-29 NOTE — ED Triage Notes (Signed)
PT states increased urinary frequency with some lower bilateral back pain that started this am. PT denies any painful urination and no vaginal discharge.

## 2016-08-20 IMAGING — DX DG CHEST 2V
2 series · 2 of 2 positions shown · non-contrast
Comparison: Chest radiograph dated 08/05/2015

CLINICAL DATA: 29-year-old female with cough

EXAM:
CHEST  2 VIEW

[chest pa]
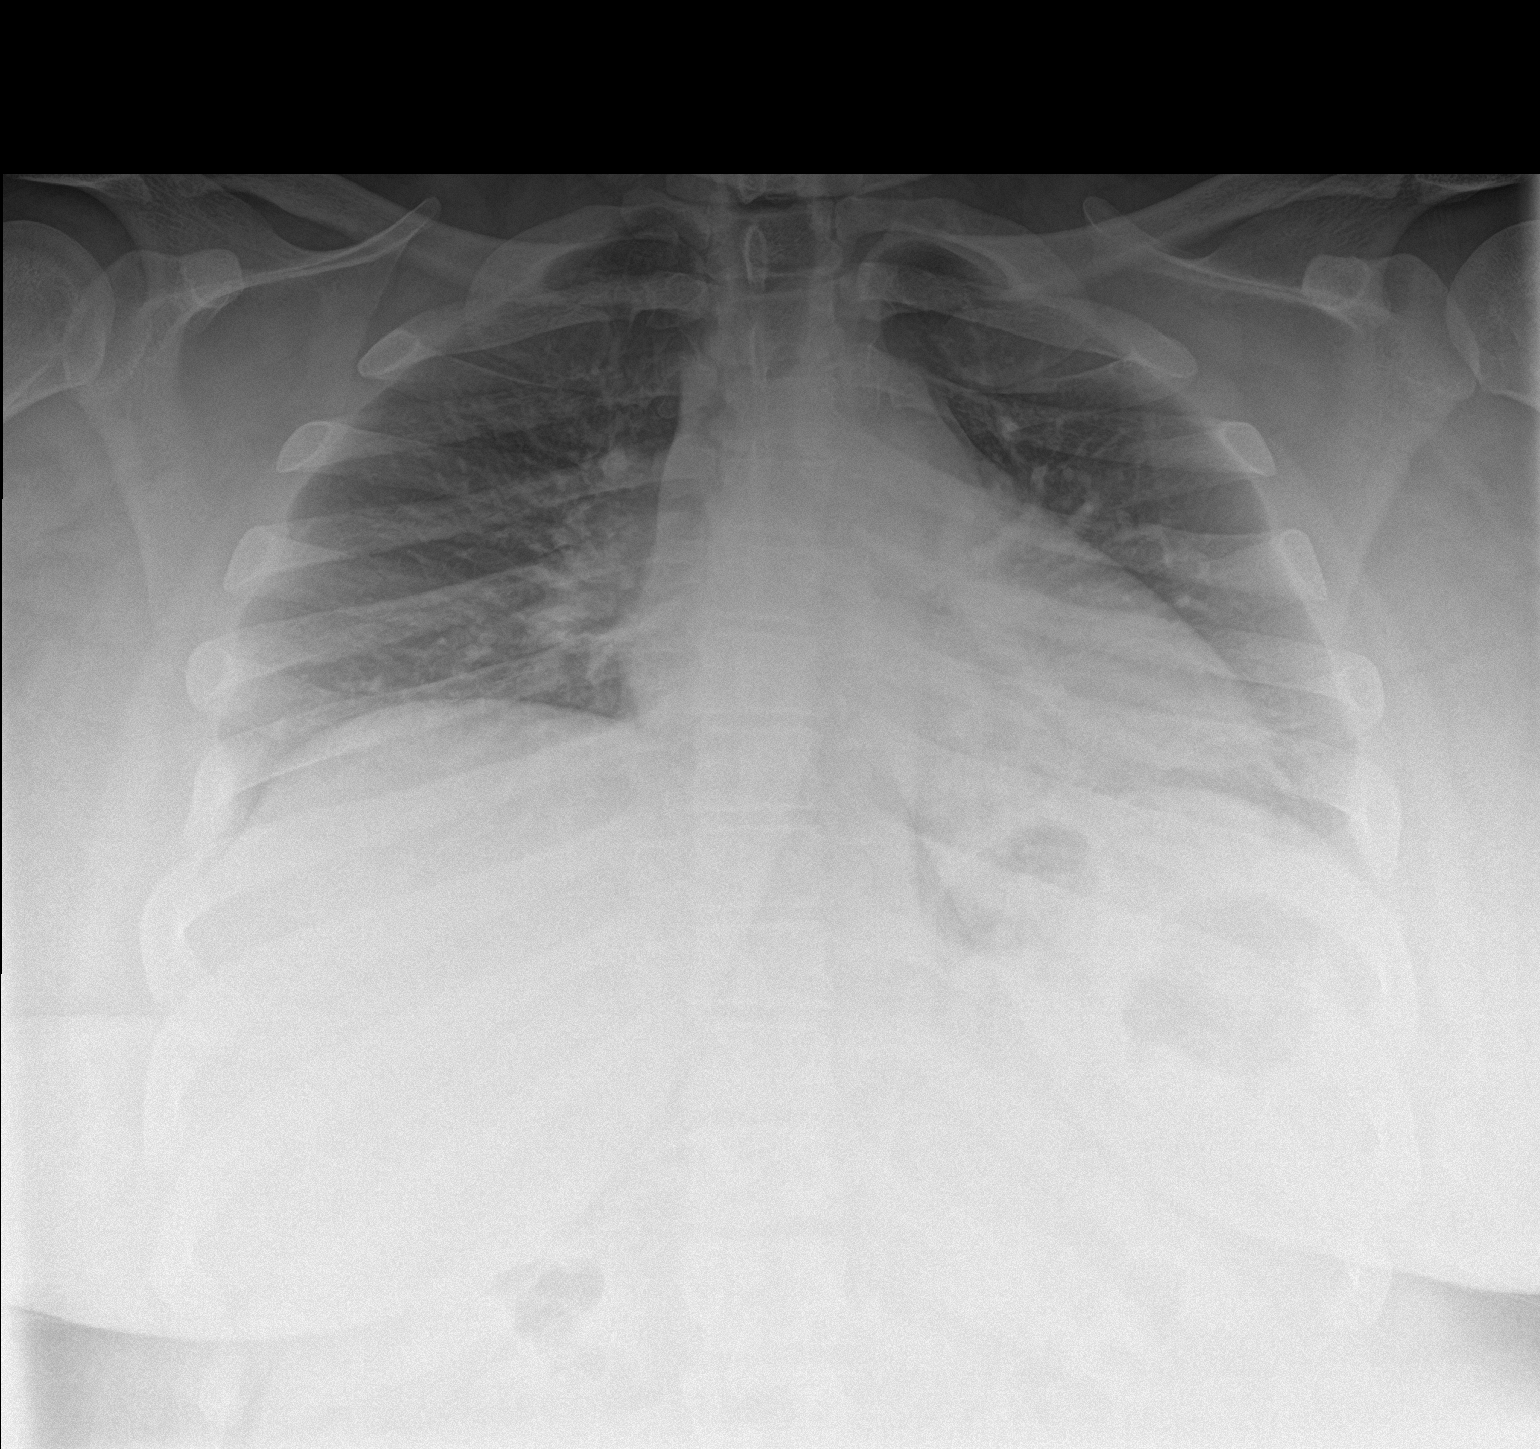

[chest lat]
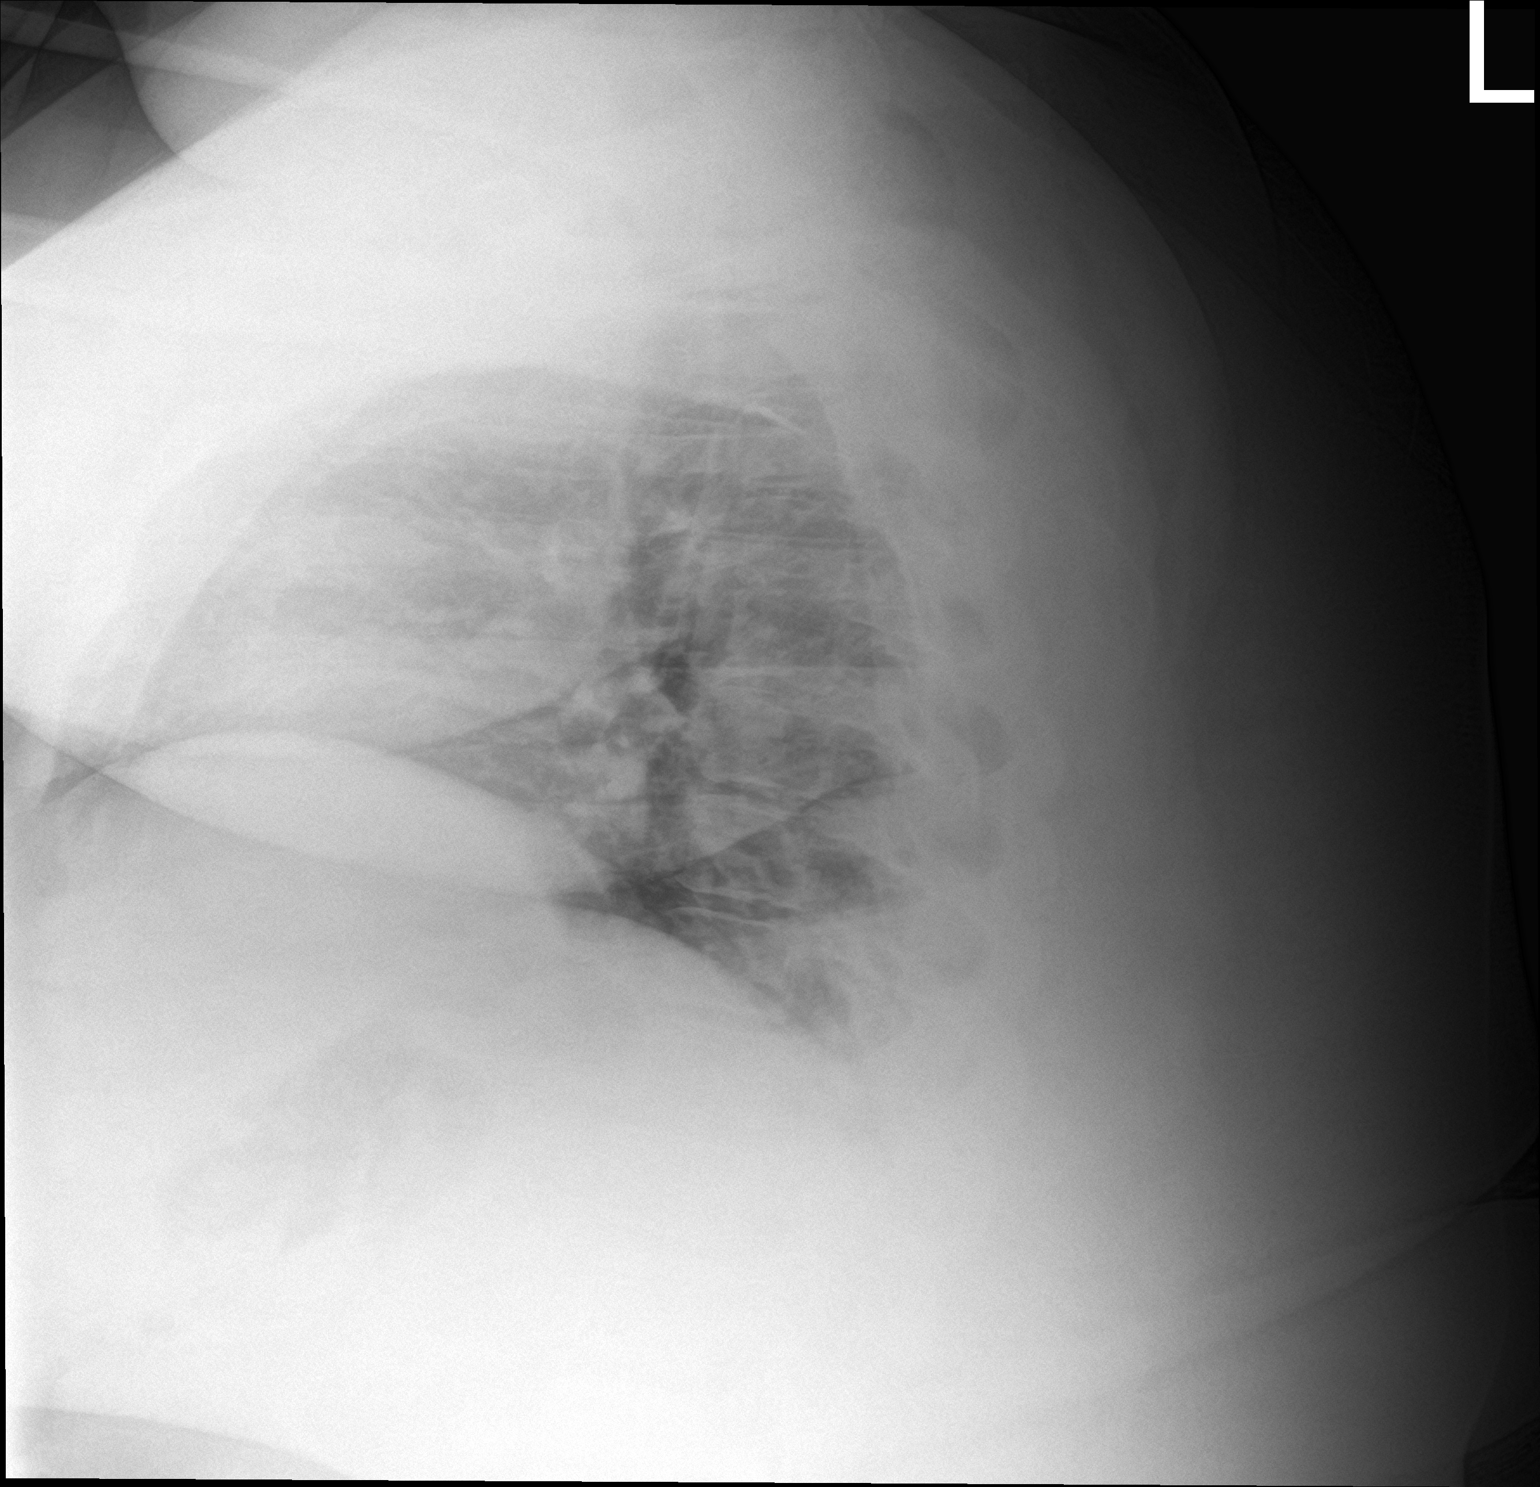

[2 of 2 positions shown; findings below may reference images not displayed]

FINDINGS: There is shallow inspiration with minimal bibasilar atelectatic
changes. No focal consolidation, pleural effusion, or pneumothorax.
The cardiac silhouette is within normal limits. No acute osseous
pathology.
IMPRESSION: No active cardiopulmonary disease.

## 2017-01-06 IMAGING — CT CT NECK W/ CM
3 of 4 series · 12 of 33 positions shown, 14 images · IV contrast (iopamidol)
Comparison: No comparison neck CT.  Prior head CT 11/04/2008.

CLINICAL DATA: 30-year-old hypertensive female with sore throat and
right ear pain for 2 weeks. Difficulty swallowing. Initial
encounter.

EXAM:
CT NECK WITH CONTRAST
TECHNIQUE: Multidetector CT imaging of the neck was performed using the
standard protocol following the bolus administration of intravenous
contrast.
CONTRAST:  75mL 6BR23F-HBB IOPAMIDOL (6BR23F-HBB) INJECTION 61%

[Series 4: sag neck · sagittal · 0.46mm/px · 5 of 143 slices shown, 6 images]
[im 48/143  bone]
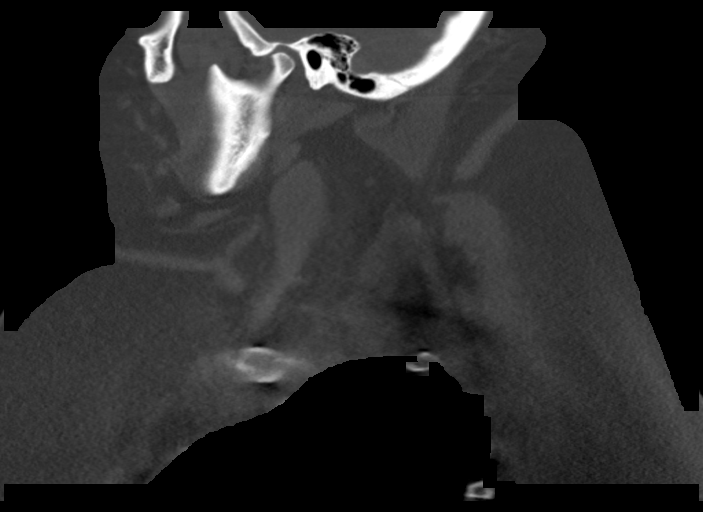
[im 60/143  bone]
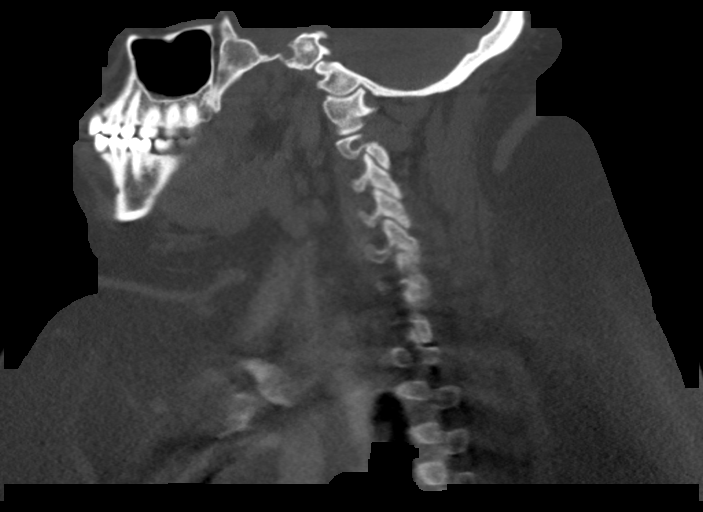
[im 72/143  soft-tissue]
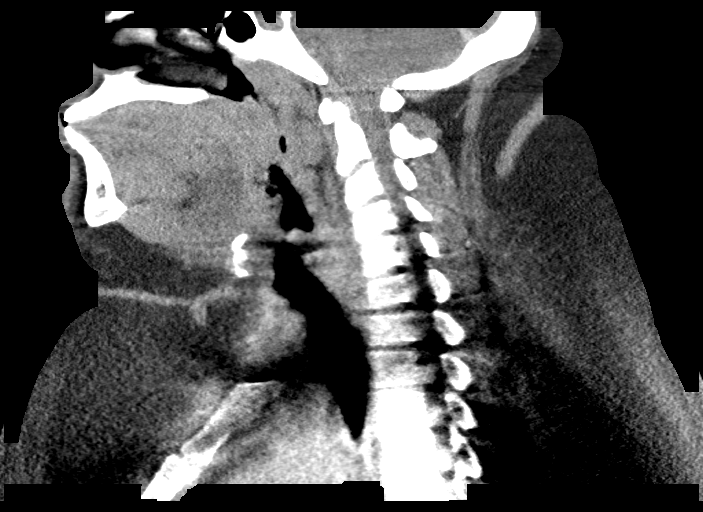
[im 72/143  bone]
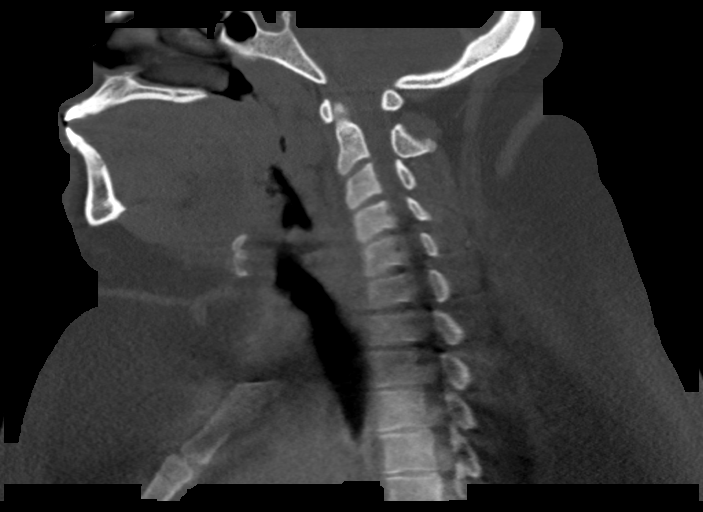
[im 83/143  bone]
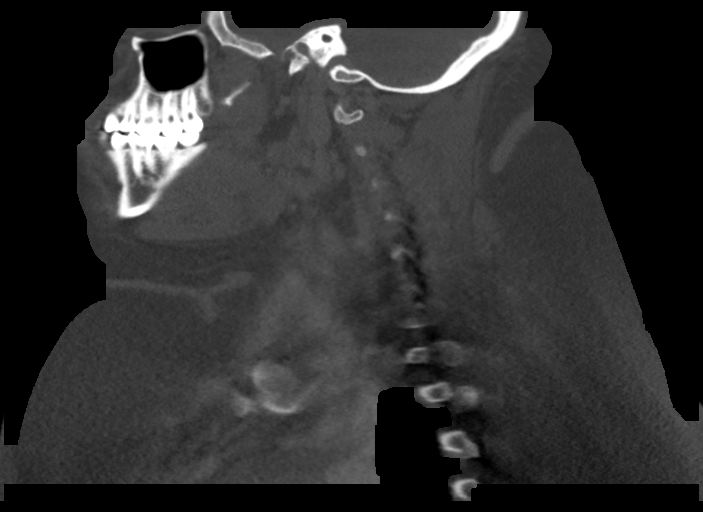
[im 95/143  bone]
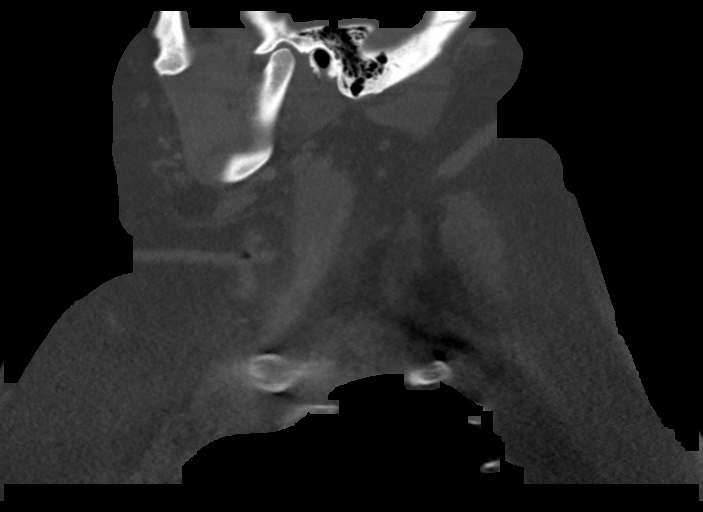

[Series 5: cor neck · coronal · 0.48mm/px · 3 of 133 slices shown]
[im 27/133  bone]
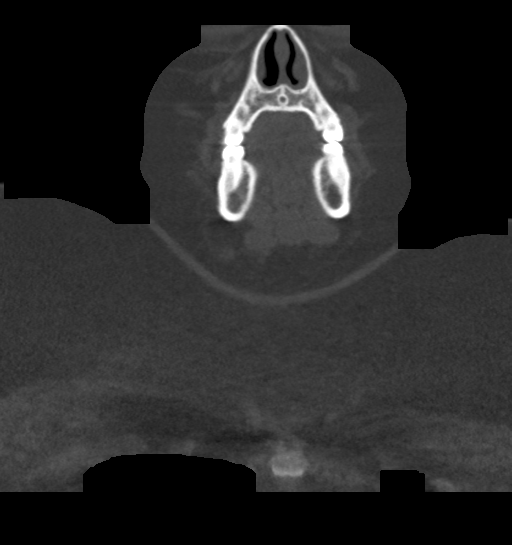
[im 53/133  bone]
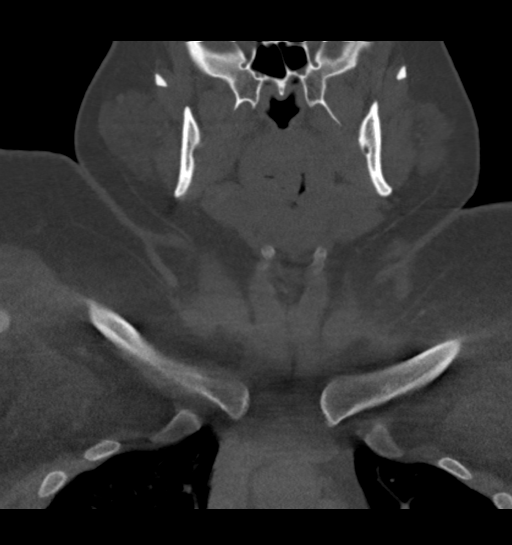
[im 80/133  bone]
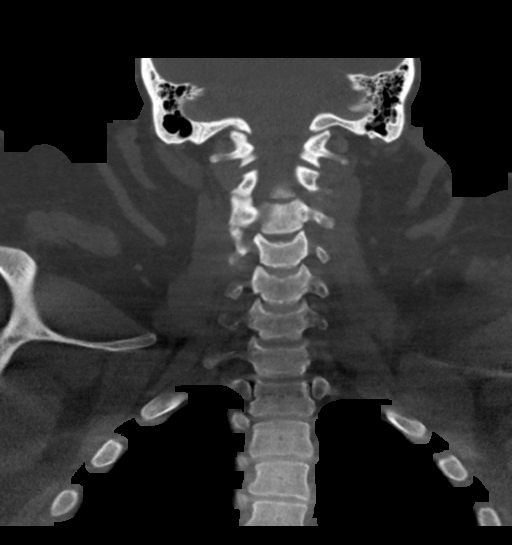

[Series 6: orthogonal ax · axial · 0.39mm/px · z∈[+14,+195]mm · 4 of 130 slices shown, 5 images]
[im 19/130  soft-tissue]
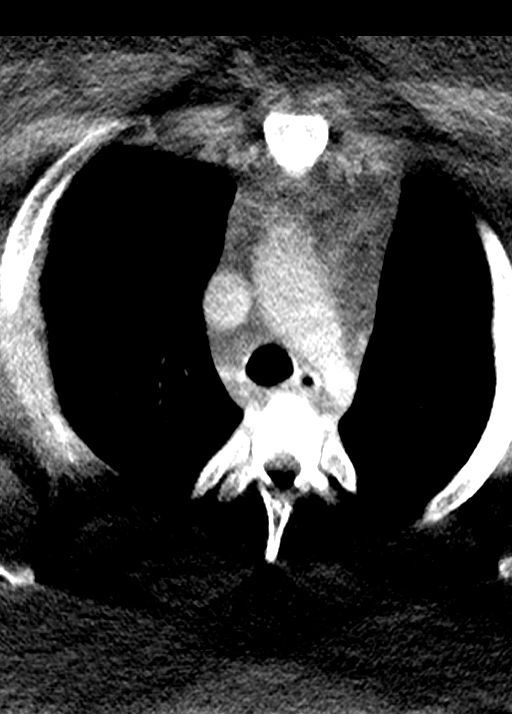
[im 19/130  bone]
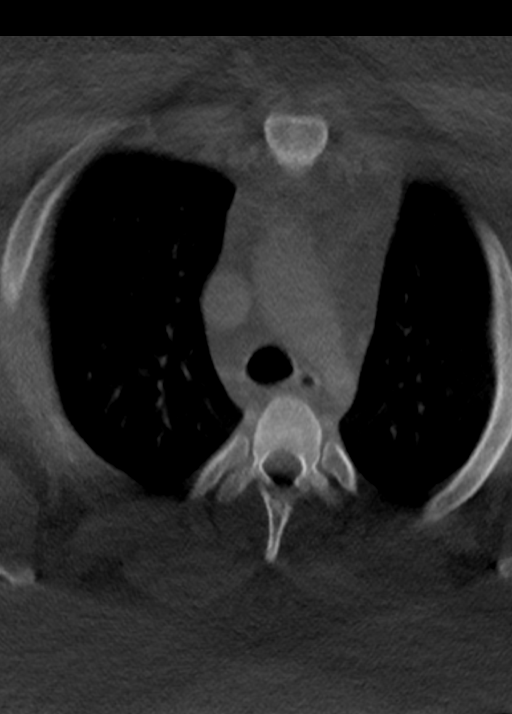
[im 56/130  bone]
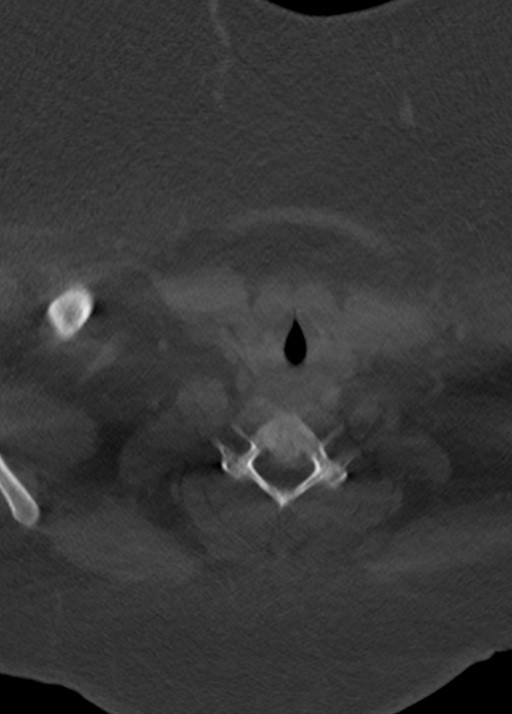
[im 74/130  bone]
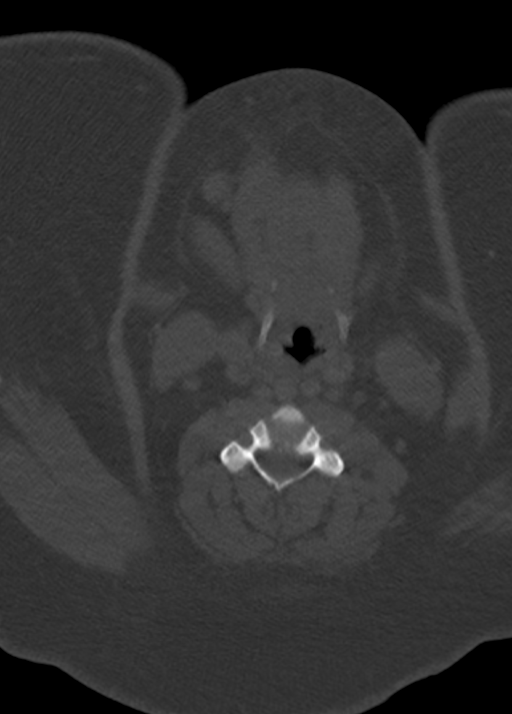
[im 111/130  bone]
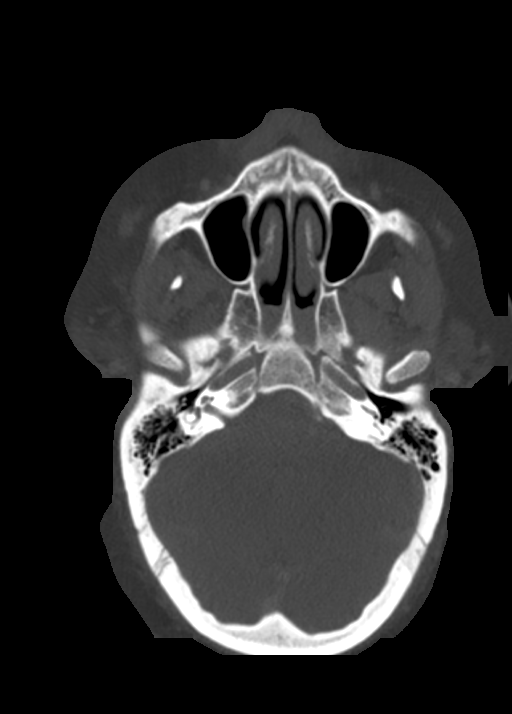

[12 of 33 positions shown; findings below may reference images not displayed]

FINDINGS: Pharynx and larynx: Diffuse enlargement of lymphoid tissue of
Waldeyer's ring including bilateral palatine tonsil enlargement
narrowing of the air column suggestive of inflammation without
drainable abscess or parapharyngeal extension of inflammatory
process. Inflammation extends to the glottic region. The tip of the
epiglottis does not appear enlarged.

Salivary glands: No primary parotid or salivary gland abnormality.

Thyroid: Evaluation limited by patient's habitus and artifact.

Lymph nodes: Adenopathy most notable level 2 region bilaterally
probably reactive in origin.

Vascular: Medially displaced carotid arteries and internal jugular
veins. Evaluation limited by patient's habitus.

Limited intracranial: Partially empty slightly expanded sella
without secondary findings of pseudotumor.

Visualized orbits: Portions of the orbits visualized suggest
exophthalmos.

Mastoids and visualized paranasal sinuses: Mastoid air cells and
middle ear cavities are clear. Minimal mucosal thickening inferior
right maxillary sinus.

Skeleton: Evaluation of cervical spine region limited by patient's
habitus. No obvious osseous abnormality.

Upper chest: Visualized upper lungs clear.

Other: Negative
IMPRESSION: Evaluation limited by patient's habitus and subsequent artifact.

Diffuse enlargement of lymphoid tissue of Waldeyer's ring including
bilateral palatine tonsil enlargement narrowing of the air column
suggestive of inflammation without drainable abscess or
parapharyngeal extension of inflammatory process. Inflammation
extends to the glottic region. The tip of the epiglottis does not
appear enlarged.

Adenopathy most notable level 2 region bilaterally probably reactive
in origin.

Partially empty slightly expanded sella without secondary findings
of pseudotumor.

Mastoid air cells and middle ear cavities are clear.

Minimal mucosal thickening inferior right maxillary sinus.

## 2017-03-05 DIAGNOSIS — Z6841 Body Mass Index (BMI) 40.0 and over, adult: Secondary | ICD-10-CM | POA: Insufficient documentation

## 2017-06-03 ENCOUNTER — Emergency Department (HOSPITAL_COMMUNITY): Payer: PRIVATE HEALTH INSURANCE

## 2017-06-03 ENCOUNTER — Emergency Department (HOSPITAL_COMMUNITY)
Admission: EM | Admit: 2017-06-03 | Discharge: 2017-06-03 | Disposition: A | Discharge status: Home / Self Care | Payer: PRIVATE HEALTH INSURANCE | Attending: Emergency Medicine | Admitting: Emergency Medicine

## 2017-06-03 ENCOUNTER — Other Ambulatory Visit: Payer: Self-pay

## 2017-06-03 ENCOUNTER — Encounter (HOSPITAL_COMMUNITY): Payer: Self-pay | Admitting: *Deleted

## 2017-06-03 DIAGNOSIS — I10 Essential (primary) hypertension: Secondary | ICD-10-CM | POA: Diagnosis not present

## 2017-06-03 DIAGNOSIS — J069 Acute upper respiratory infection, unspecified: Secondary | ICD-10-CM | POA: Insufficient documentation

## 2017-06-03 DIAGNOSIS — Z79899 Other long term (current) drug therapy: Secondary | ICD-10-CM | POA: Diagnosis not present

## 2017-06-03 DIAGNOSIS — R05 Cough: Secondary | ICD-10-CM | POA: Diagnosis present

## 2017-06-03 DIAGNOSIS — B9789 Other viral agents as the cause of diseases classified elsewhere: Secondary | ICD-10-CM | POA: Diagnosis not present

## 2017-06-03 MED ORDER — PREDNISONE 20 MG PO TABS: ORAL_TABLET | ORAL | 0 refills | Status: DC

## 2017-06-03 MED ORDER — BENZONATATE 100 MG PO CAPS: 100.0000 mg | ORAL_CAPSULE | Freq: Three times a day (TID) | ORAL | 0 refills | Status: DC

## 2017-06-03 MED ORDER — ALBUTEROL SULFATE (2.5 MG/3ML) 0.083% IN NEBU
5.0000 mg | INHALATION_SOLUTION | Freq: Once | RESPIRATORY_TRACT | Status: AC
  Administered 2017-06-03: 5 mg via RESPIRATORY_TRACT
  Filled 2017-06-03: qty 6

## 2017-06-03 NOTE — ED Notes (Signed)
Respiratory at bedside.

## 2017-06-03 NOTE — ED Provider Notes (Signed)
[facility] EMERGENCY DEPARTMENT Provider Note   CSN: [id number] Arrival date & time: 06/03/17  1757     History   Chief Complaint Chief Complaint  Patient presents with  . Cough    HPI Natalie Reynolds is a 32 y.o. female.  Patient seen at urgent care last Wednesday with URI symptoms. She was started on mucinex and nasal saline.  She has continued to have a persistent cough with occasional wheeze. Sometimes feels short of breath during coughing spells.  No documented fevers. No chills.   The history is provided by the patient. No language interpreter was used.  Cough  This is a new problem. The current episode started more than 1 week ago. The problem occurs every few minutes. The problem has been gradually worsening. The cough is non-productive. There has been no fever. Associated symptoms include ear congestion, ear pain, rhinorrhea and wheezing. She has tried decongestants and cough syrup for the symptoms. The treatment provided no relief.    Past Medical History:  Diagnosis Date  . Hypertension     There are no active problems to display for this patient.   History reviewed. No pertinent surgical history.   OB History   None      Home Medications    Prior to Admission medications   Medication Sig Start Date End Date Taking? Authorizing Provider  albuterol (PROVENTIL) (2.5 MG/3ML) 0.083% nebulizer solution Take 3-6 mLs (2.5-5 mg total) by nebulization every 4 (four) hours as needed for wheezing or shortness of breath. 08/12/15   Muthersbaugh, Dahlia Client, PA-C  ibuprofen (ADVIL,MOTRIN) 600 MG tablet Take 1 tablet (600 mg total) by mouth every 6 (six) hours as needed. 04/29/16   Audry Pili, PA-C  losartan-hydrochlorothiazide (HYZAAR) 50-12.5 MG tablet Take 1 tablet by mouth daily. 04/07/16   Mancel Bale, MD  methocarbamol (ROBAXIN) 500 MG tablet Take 1 tablet (500 mg total) by mouth 2 (two) times daily. 04/29/16   Audry Pili, PA-C    Family History History reviewed. No  pertinent family history.  Social History Social History   Tobacco Use  . Smoking status: Never Smoker  . Smokeless tobacco: Never Used  Substance Use Topics  . Alcohol use: No  . Drug use: No     Allergies   Patient has no known allergies.   Review of Systems Review of Systems  Constitutional: Negative for fever.  HENT: Positive for ear pain and rhinorrhea.   Respiratory: Positive for cough and wheezing.   All other systems reviewed and are negative.    Physical Exam Updated Vital Signs BP (!) 112/91 (BP Location: Right Arm)   Pulse 91   Temp 98.4 F (36.9 C) (Oral)   Resp (!) 22   Ht  (1.651 m)   Wt (!) 168.3 kg (371 lb)   LMP 05/26/2017   SpO2 100%   BMI 61.74 kg/m   Physical Exam  Constitutional: She is oriented to person, place, and time. She appears well-developed and well-nourished.  HENT:  Head: Normocephalic.  Eyes: Conjunctivae are normal.  Neck: Neck supple.  Cardiovascular: Normal rate and regular rhythm.  Pulmonary/Chest: Effort normal. She has wheezes. She exhibits no tenderness.  Abdominal: Soft.  Musculoskeletal: Normal range of motion.  Neurological: She is alert and oriented to person, place, and time.  Skin: Skin is warm and dry.  Psychiatric: She has a normal mood and affect.  Nursing note and vitals reviewed.    ED Treatments / Results  Labs (all labs ordered are  listed, but only abnormal results are displayed) Labs Reviewed - No data to display  EKG None  Radiology Dg Chest 2 View  Result Date: 06/03/2017 CLINICAL DATA:  Cough, shortness of breath, mid chest pain EXAM: CHEST - 2 VIEW COMPARISON:  08/11/2015 FINDINGS: Lungs are clear.  No pleural effusion or pneumothorax. The heart is normal in size. Visualized osseous structures are within normal limits. IMPRESSION: Normal chest radiographs. Electronically Signed   By: Charline Bills M.D.   On: 06/03/2017 19:18    Procedures Procedures (including critical care  time)  Medications Ordered in ED Medications - No data to display   Initial Impression / Assessment and Plan / ED Course  I have reviewed the triage vital signs and the nursing notes.  Pertinent labs & imaging results that were available during my care of the patient were reviewed by me and considered in my medical decision making (see chart for details).     Pt symptoms consistent with URI. CXR negative for acute infiltrate. Pt will be discharged with symptomatic treatment. Prednisone and tessalon. Discussed return precautions.  Pt is hemodynamically stable & in NAD prior to discharge.  Final Clinical Impressions(s) / ED Diagnoses   Final diagnoses:  Viral URI with cough    ED Discharge Orders        Ordered    predniSONE (DELTASONE) 20 MG tablet     06/03/17 2022    benzonatate (TESSALON) 100 MG capsule  Every 8 hours     06/03/17 2022       Felicie Morn, NP 06/03/17 1610    Vanetta Mulders, MD 06/05/17 [phone]

## 2017-06-03 NOTE — ED Triage Notes (Signed)
Pt with NP cough for a week, denies fever.  Pt has been seen at an Urgent Care as well. Right ear feels stopped up per pt.

## 2018-05-14 DIAGNOSIS — I10 Essential (primary) hypertension: Secondary | ICD-10-CM | POA: Insufficient documentation

## 2018-05-14 DIAGNOSIS — M7989 Other specified soft tissue disorders: Secondary | ICD-10-CM | POA: Insufficient documentation

## 2018-06-13 IMAGING — DX DG CHEST 2V
2 series · 2 of 2 positions shown · non-contrast
Comparison: 08/11/2015

CLINICAL DATA: Cough, shortness of breath, mid chest pain

EXAM:
CHEST - 2 VIEW

[chest pa]
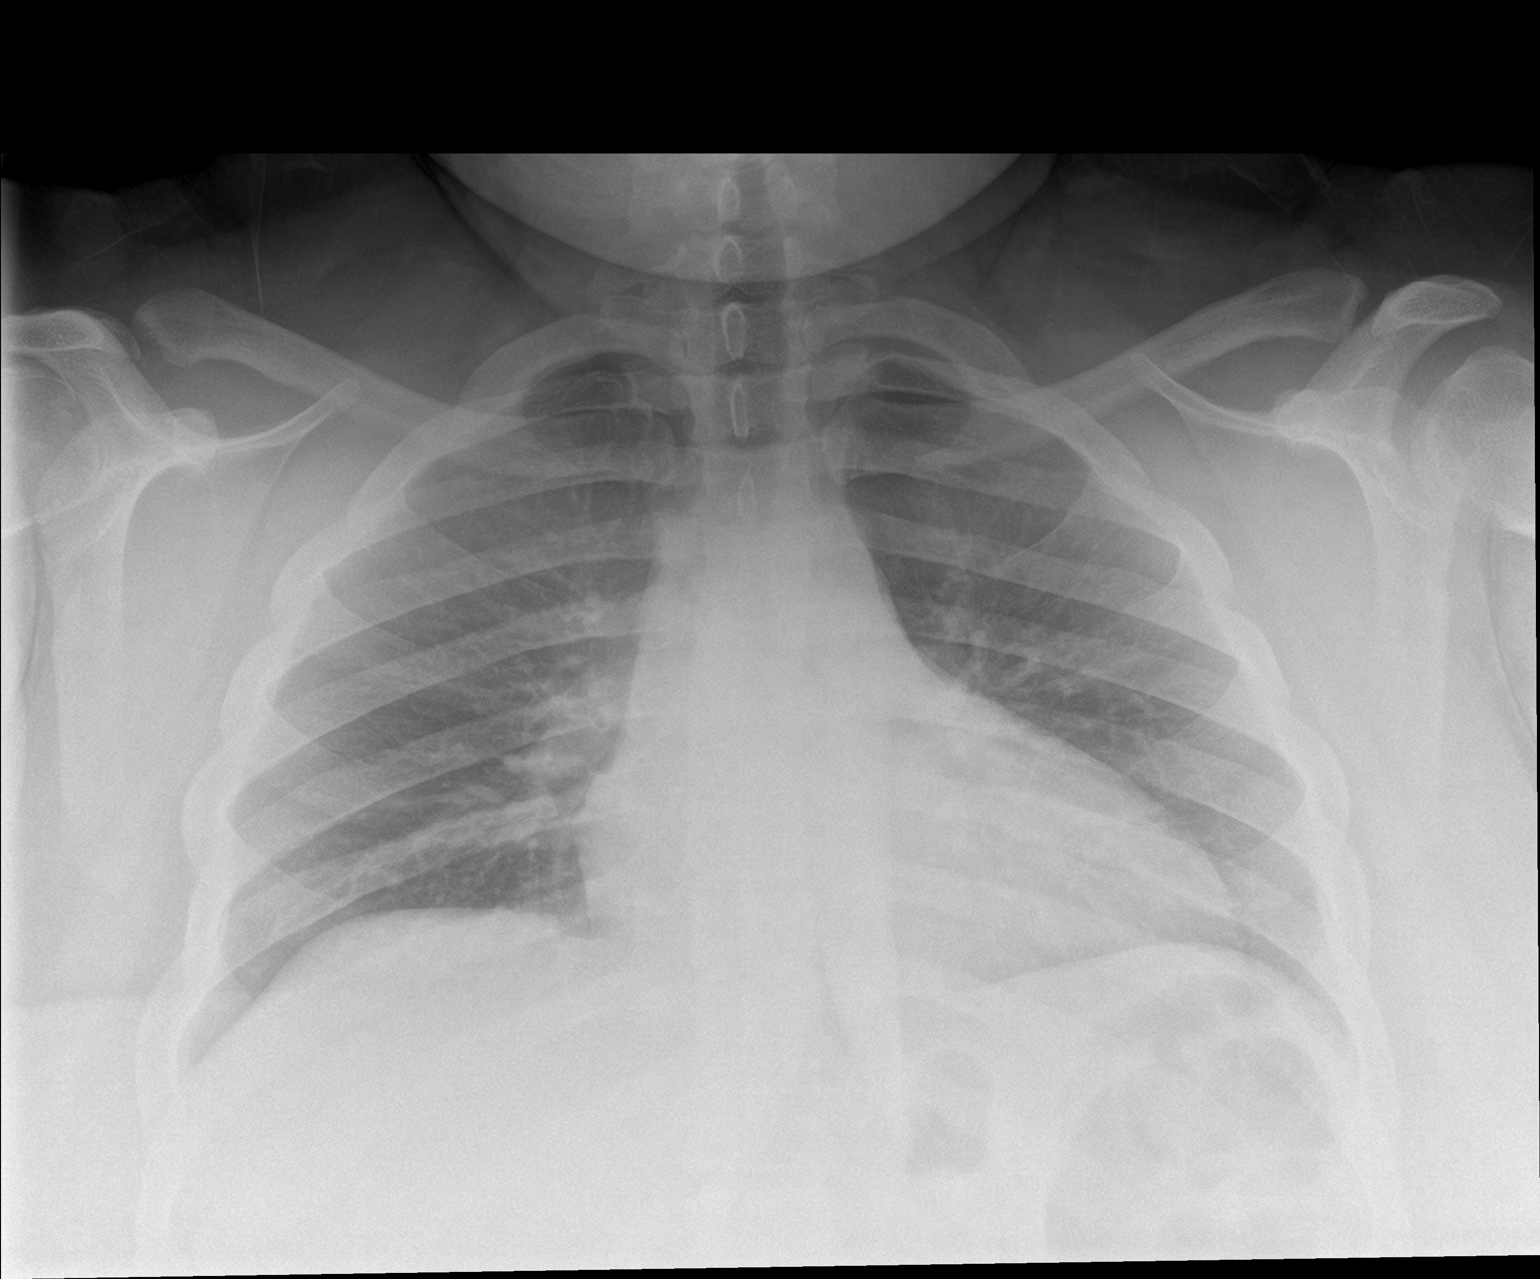

[chest lat]
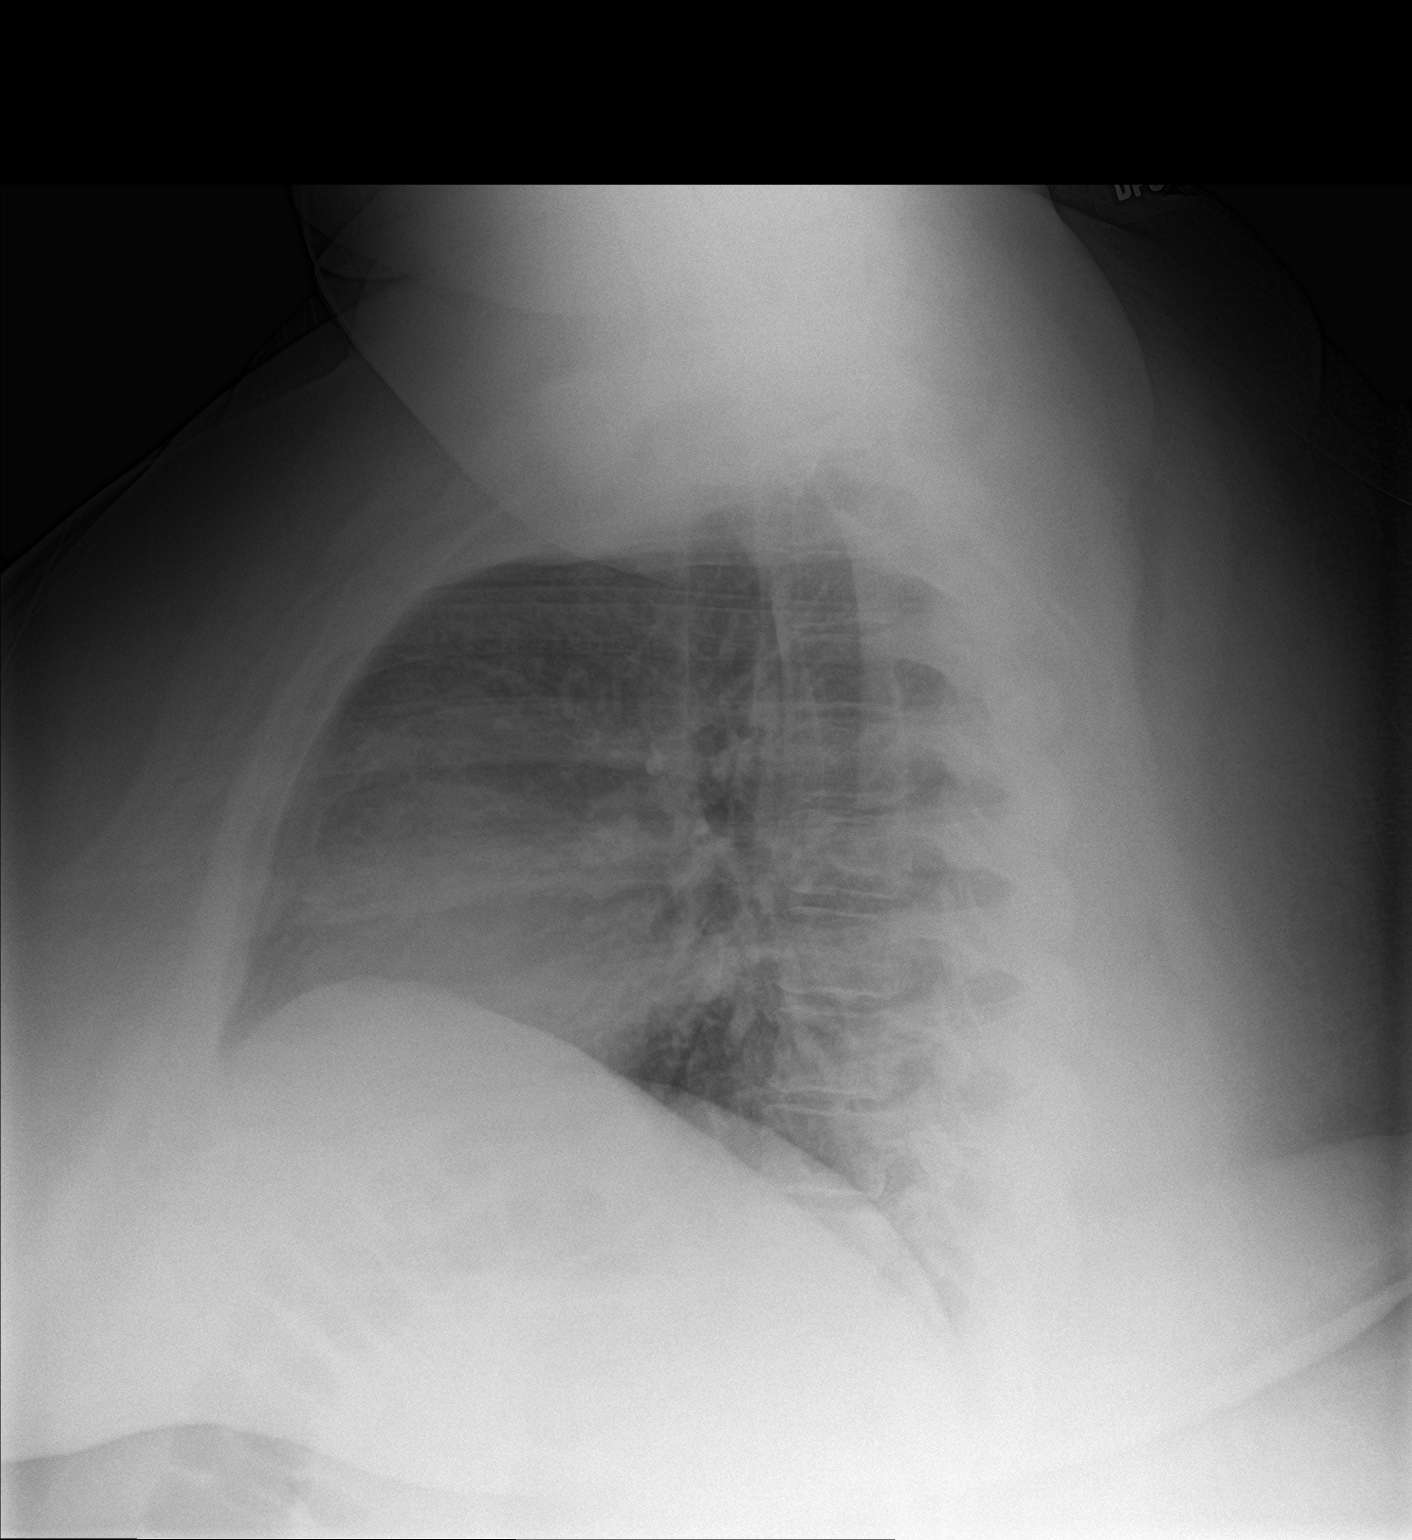

[2 of 2 positions shown; findings below may reference images not displayed]

FINDINGS: Lungs are clear.  No pleural effusion or pneumothorax.

The heart is normal in size.

Visualized osseous structures are within normal limits.
IMPRESSION: Normal chest radiographs.

## 2018-07-02 ENCOUNTER — Other Ambulatory Visit: Payer: Self-pay

## 2018-07-02 ENCOUNTER — Ambulatory Visit (INDEPENDENT_AMBULATORY_CARE_PROVIDER_SITE_OTHER): Payer: Managed Care, Other (non HMO)

## 2018-07-02 ENCOUNTER — Ambulatory Visit
Admission: EM | Admit: 2018-07-02 | Discharge: 2018-07-02 | Disposition: A | Discharge status: Home / Self Care | Payer: Managed Care, Other (non HMO)

## 2018-07-02 DIAGNOSIS — R21 Rash and other nonspecific skin eruption: Secondary | ICD-10-CM | POA: Diagnosis not present

## 2018-07-02 DIAGNOSIS — R0981 Nasal congestion: Secondary | ICD-10-CM

## 2018-07-02 DIAGNOSIS — T7840XA Allergy, unspecified, initial encounter: Secondary | ICD-10-CM | POA: Diagnosis not present

## 2018-07-02 DIAGNOSIS — R05 Cough: Secondary | ICD-10-CM

## 2018-07-02 DIAGNOSIS — J209 Acute bronchitis, unspecified: Secondary | ICD-10-CM

## 2018-07-02 DIAGNOSIS — R059 Cough, unspecified: Secondary | ICD-10-CM

## 2018-07-02 DIAGNOSIS — R030 Elevated blood-pressure reading, without diagnosis of hypertension: Secondary | ICD-10-CM | POA: Diagnosis not present

## 2018-07-02 MED ORDER — DEXAMETHASONE SODIUM PHOSPHATE 10 MG/ML IJ SOLN: 10.0000 mg | Freq: Once | INTRAMUSCULAR | Status: DC

## 2018-07-02 MED ORDER — TRIAMCINOLONE ACETONIDE 0.1 % EX CREA: 1.0000 "application " | TOPICAL_CREAM | Freq: Two times a day (BID) | CUTANEOUS | 0 refills | Status: DC

## 2018-07-02 MED ORDER — DEXAMETHASONE SODIUM PHOSPHATE 10 MG/ML IJ SOLN
10.0000 mg | Freq: Once | INTRAMUSCULAR | Status: AC
  Administered 2018-07-02: 10 mg via INTRAMUSCULAR

## 2018-07-02 MED ORDER — PREDNISONE 20 MG PO TABS: 20.0000 mg | ORAL_TABLET | Freq: Two times a day (BID) | ORAL | 0 refills | Status: AC

## 2018-07-02 MED ORDER — BENZONATATE 100 MG PO CAPS: 100.0000 mg | ORAL_CAPSULE | Freq: Three times a day (TID) | ORAL | 0 refills | Status: DC

## 2018-07-02 NOTE — ED Triage Notes (Signed)
Pt completed antibiotic for strep and then developed cough, pt took nyquil last night and then having swelling in feet and lips

## 2018-07-02 NOTE — Discharge Instructions (Signed)
Decadron shot given in office Chest x-rays did not show cardiopulmonary disease Get plenty of rest and push fluids Continue breathing treatments and albuterol inhaler at home as needed for wheezing and/or shortness of breath Prednisone prescribed.  Take as directed and to completion.  Steroid will help with cough as well as possible allergic reaction rash.   Tessalon perles prescribed for cough.  Take as needed for cough Follow up with PCP next week for recheck and/or if symptoms persists Return or go to ER if you have any new or worsening symptoms such as fever, chills, fatigue, shortness of breath, wheezing, chest pain, nausea, changes in bowel or bladder habits, oral manifestations such as: throat swelling/ tingling, mouth swelling/ tingling, tongue swelling/tingling, etc...  Blood pressure elevated in office.  Please recheck in 24 hours.  If it continues to be greater than 140/90 please follow up with PCP for further evaluation and management.

## 2018-07-02 NOTE — ED Provider Notes (Signed)
[place]   [id number] 07/02/18 Arrival Time: 63  Cc: COUGH and allergic reaction  SUBJECTIVE:  Natalie Reynolds is a 33 y.o. female hx significant for HTN, and H/O asthma, who presents with persistent dry cough x 8 days.  Denies sick exposure to COVID, flu or strep.  Denies recent travel.  Recently had an e-visit and treated for strep with amoxicillin on 06/17/2018.  Strep was positive, COVID was negative on 06/27/2018.  Has tried breathing treatments, albuterol inhaler, and nyquil without relief.  Symptoms are made worse at night.  Reports previous symptoms in the past related to sinus infection.   Does admit to wheezing, and chest congestion.  Denies fever, chills, fatigue, sinus pain, rhinorrhea, sore throat, SOB, chest pain, nausea, vomiting, changes in bowel or bladder habits.    Also mentions rash x 4 days and upper lip "feeling weird" x 1 day.  Attributes her symptoms to taking nyquil, but also admits to changing detergents around time of symptoms.  Localizes rash to bilateral lower extremities, and upper lip.  Denies itching or pain.  Denies erythema, redness, swollen glands, oral manifestations such as throat swelling/ tingling, mouth swelling/ tingling, tongue swelling/tingling, dysphagia, dyspnea, abdominal pain.  ROS: As per HPI.  Past Medical History:  Diagnosis Date  . Hypertension    History reviewed. No pertinent surgical history. Allergies  Allergen Reactions  . Hydrochlorothiazide     Other reaction(s): Headache   No current facility-administered medications on file prior to encounter.    Current Outpatient Medications on File Prior to Encounter  Medication Sig Dispense Refill  . chlorthalidone (HYGROTON) 25 MG tablet Take 25 mg by mouth daily.    Marland Kitchen losartan (COZAAR) 50 MG tablet Take 25 mg by mouth daily.    Marland Kitchen albuterol (PROVENTIL) (2.5 MG/3ML) 0.083% nebulizer solution Take 3-6 mLs (2.5-5 mg total) by nebulization every 4 (four) hours as needed for  wheezing or shortness of breath. 75 mL 12  . ibuprofen (ADVIL,MOTRIN) 600 MG tablet Take 1 tablet (600 mg total) by mouth every 6 (six) hours as needed. 30 tablet 0  . [DISCONTINUED] losartan-hydrochlorothiazide (HYZAAR) 50-12.5 MG tablet Take 1 tablet by mouth daily. 90 tablet 0    Social History   Socioeconomic History  . Marital status: Single    Spouse name: Not on file  . Number of children: Not on file  . Years of education: Not on file  . Highest education level: Not on file  Occupational History  . Not on file  Social Needs  . Financial resource strain: Not on file  . Food insecurity    Worry: Not on file    Inability: Not on file  . Transportation needs    Medical: Not on file    Non-medical: Not on file  Tobacco Use  . Smoking status: Never Smoker  . Smokeless tobacco: Never Used  Substance and Sexual Activity  . Alcohol use: No  . Drug use: No  . Sexual activity: Not on file  Lifestyle  . Physical activity    Days per week: Not on file    Minutes per session: Not on file  . Stress: Not on file  Relationships  . Social Herbalist on phone: Not on file    Gets together: Not on file    Attends religious service: Not on file    Active member of club or organization: Not on file    Attends meetings of clubs or organizations: Not on file  Relationship status: Not on file  . Intimate partner violence    Fear of current or ex partner: Not on file    Emotionally abused: Not on file    Physically abused: Not on file    Forced sexual activity: Not on file  Other Topics Concern  . Not on file  Social History Narrative  . Not on file   History reviewed. No pertinent family history.   OBJECTIVE:  Vitals:   07/02/18 1314  BP: (!) 173/96  Pulse: 88  Resp: 20  Temp: 99 F (37.2 C)  TempSrc: Oral  SpO2: 97%     General appearance: Alert, appears mildly fatigued, but nontoxic; speaking in full sentences without difficulty; obese HEENT:NCAT; no  obvious facial swelling; Ears: EACs clear, TMs pearly gray; Eyes: PERRL.  EOM grossly intact. Nose: nares patent without rhinorrhea; Lips: no obvious swelling or rash; Throat: tonsils nonerythematous or enlarged, uvula midline  Neck: supple without LAD Lungs: difficult to assess due to body habitus; persistent cough present Heart: regular rate and rhythm.   Skin: warm and dry; sparse papular rash localized to bilateral feet, NTTP, no obvious drainage or swelling Psychological: alert and cooperative; normal mood and affect  DIAGNOSTIC STUDIES:  Dg Chest 2 View  Result Date: 07/02/2018 CLINICAL DATA:  Cough and congestion for 1 week. EXAM: CHEST - 2 VIEW COMPARISON:  PA and lateral chest 06/03/2017. FINDINGS: The lungs are clear. Heart size normal. No pneumothorax or pleural effusion. No bony abnormality. IMPRESSION: Negative chest. Electronically Signed   By: Drusilla Kannerhomas  Dalessio M.D.   On: 07/02/2018 14:18     ASSESSMENT & PLAN:  1. Acute bronchitis, unspecified organism   2. Cough   3. Allergic reaction, initial encounter   4. Elevated blood pressure reading     Meds ordered this encounter  Medications  . DISCONTD: dexamethasone (DECADRON) injection 10 mg  . dexamethasone (DECADRON) injection 10 mg  . predniSONE (DELTASONE) 20 MG tablet    Sig: Take 1 tablet (20 mg total) by mouth 2 (two) times daily with a meal for 5 days.    Dispense:  10 tablet    Refill:  0    Order Specific Question:   Supervising Provider    Answer:   Eustace MooreNELSON, YVONNE SUE [[id number]]  . benzonatate (TESSALON) 100 MG capsule    Sig: Take 1 capsule (100 mg total) by mouth every 8 (eight) hours.    Dispense:  21 capsule    Refill:  0    Order Specific Question:   Supervising Provider    Answer:   Eustace MooreNELSON, YVONNE SUE [[id number]]  . triamcinolone cream (KENALOG) 0.1 %    Sig: Apply 1 application topically 2 (two) times daily.    Dispense:  30 g    Refill:  0    Order Specific Question:   Supervising Provider     Answer:   Eustace MooreELSON, YVONNE SUE [[id number]]    Orders Placed This Encounter  Procedures  . DG Chest 2 View    Standing Status:   Standing    Number of Occurrences:   1    Order Specific Question:   Reason for Exam (SYMPTOM  OR DIAGNOSIS REQUIRED)    Answer:   cough x 1 week    Decadron shot given in office Chest x-rays did not show cardiopulmonary disease.   Get plenty of rest and push fluids Continue breathing treatments and albuterol inhaler at home as needed for wheezing and/or shortness of breath Prednisone prescribed.  Take as directed and to completion.  Steroid will help with cough as well as possible allergic reaction rash.   Triamcinolone cream prescribed for rash as well Tessalon perles prescribed for cough.  Take as needed for cough Follow up with PCP next week for recheck and/or if symptoms persists Return or go to ER if you have any new or worsening symptoms such as fever, chills, fatigue, shortness of breath, wheezing, chest pain, nausea, changes in bowel or bladder habits, oral manifestations such as: throat swelling/ tingling, mouth swelling/ tingling, tongue swelling/tingling, etc...  Blood pressure elevated in office.  Please recheck in 24 hours.  If it continues to be greater than 140/90 please follow up with PCP for further evaluation and management.    Reviewed expectations re: course of current medical issues. Questions answered. Outlined signs and symptoms indicating need for more acute intervention. Patient verbalized understanding. After Visit Summary given.          Rennis HardingWurst, Gabriella Woodhead, PA-C 07/02/18 1424

## 2019-02-18 DIAGNOSIS — J4520 Mild intermittent asthma, uncomplicated: Secondary | ICD-10-CM | POA: Insufficient documentation

## 2019-03-12 DIAGNOSIS — R0609 Other forms of dyspnea: Secondary | ICD-10-CM | POA: Insufficient documentation

## 2019-05-17 ENCOUNTER — Encounter: Payer: Self-pay | Admitting: Emergency Medicine

## 2019-05-17 ENCOUNTER — Ambulatory Visit
Admission: EM | Admit: 2019-05-17 | Discharge: 2019-05-17 | Disposition: A | Discharge status: Home / Self Care | Payer: 59

## 2019-05-17 ENCOUNTER — Other Ambulatory Visit: Payer: Self-pay

## 2019-05-17 DIAGNOSIS — R202 Paresthesia of skin: Secondary | ICD-10-CM | POA: Diagnosis not present

## 2019-05-17 DIAGNOSIS — R200 Anesthesia of skin: Secondary | ICD-10-CM

## 2019-05-17 DIAGNOSIS — G5601 Carpal tunnel syndrome, right upper limb: Secondary | ICD-10-CM

## 2019-05-17 MED ORDER — PREDNISONE 20 MG PO TABS: 20.0000 mg | ORAL_TABLET | Freq: Two times a day (BID) | ORAL | 0 refills | Status: AC

## 2019-05-17 NOTE — ED Provider Notes (Signed)
[place]   [id number] 05/17/19 Arrival Time: 3664  CC: RT hand numbness/ tingling  SUBJECTIVE: History from: patient. Natalie Reynolds is a 34 y.o. female complains of RT hand numbness/ tingling x 4-5 days.  Denies a precipitating event or specific injury.  However, does admit to using a computer for work frequently.  Localizes the symptoms to RT middle, ring and little fingers.  Describes the symptoms as intermittent.  Denies alleviating or aggravating factors.  Denies similar symptoms in the past.  Denies fever, chills, erythema, ecchymosis, effusion, weakness.  ROS: As per HPI.  All other pertinent ROS negative.     Past Medical History:  Diagnosis Date  . Hypertension    No past surgical history on file. Allergies  Allergen Reactions  . Hydrochlorothiazide     Other reaction(s): Headache   No current facility-administered medications on file prior to encounter.   Current Outpatient Medications on File Prior to Encounter  Medication Sig Dispense Refill  . chlorthalidone (HYGROTON) 25 MG tablet Take by mouth.    . ergocalciferol (VITAMIN D2) 1.25 MG (50000 UT) capsule Take by mouth.    . fluticasone (FLONASE) 50 MCG/ACT nasal spray Place into the nose.    . folic acid (FOLVITE) 403 MCG tablet Take by mouth.    Marland Kitchen albuterol (PROVENTIL) (2.5 MG/3ML) 0.083% nebulizer solution Take 3-6 mLs (2.5-5 mg total) by nebulization every 4 (four) hours as needed for wheezing or shortness of breath. 75 mL 12  . chlorthalidone (HYGROTON) 25 MG tablet Take 25 mg by mouth daily.    Marland Kitchen ibuprofen (ADVIL,MOTRIN) 600 MG tablet Take 1 tablet (600 mg total) by mouth every 6 (six) hours as needed. 30 tablet 0  . losartan (COZAAR) 50 MG tablet Take 25 mg by mouth daily.    . Potassium Gluconate 2.5 MEQ TABS Take by mouth.    . triamcinolone cream (KENALOG) 0.1 % Apply 1 application topically 2 (two) times daily. 30 g 0  . Vitamin D, Ergocalciferol, (DRISDOL) 1.25 MG (50000 UNIT) CAPS capsule  Take 50,000 Units by mouth 2 (two) times a week.    . [DISCONTINUED] losartan-hydrochlorothiazide (HYZAAR) 50-12.5 MG tablet Take 1 tablet by mouth daily. 90 tablet 0   Social History   Socioeconomic History  . Marital status: Single    Spouse name: Not on file  . Number of children: Not on file  . Years of education: Not on file  . Highest education level: Not on file  Occupational History  . Not on file  Tobacco Use  . Smoking status: Never Smoker  . Smokeless tobacco: Never Used  Substance and Sexual Activity  . Alcohol use: No  . Drug use: No  . Sexual activity: Not on file  Other Topics Concern  . Not on file  Social History Narrative  . Not on file   Social Determinants of Health   Financial Resource Strain:   . Difficulty of Paying Living Expenses:   Food Insecurity:   . Worried About Charity fundraiser in the Last Year:   . Arboriculturist in the Last Year:   Transportation Needs:   . Film/video editor (Medical):   Marland Kitchen Lack of Transportation (Non-Medical):   Physical Activity:   . Days of Exercise per Week:   . Minutes of Exercise per Session:   Stress:   . Feeling of Stress :   Social Connections:   . Frequency of Communication with Friends and Family:   . Frequency  of Social Gatherings with Friends and Family:   . Attends Religious Services:   . Active Member of Clubs or Organizations:   . Attends Banker Meetings:   Marland Kitchen Marital Status:   Intimate Partner Violence:   . Fear of Current or Ex-Partner:   . Emotionally Abused:   Marland Kitchen Physically Abused:   . Sexually Abused:    No family history on file.  OBJECTIVE:  Vitals:   05/17/19 1335  BP: (!) 169/100  Pulse: 78  Resp: 17  Temp: 98.8 F (37.1 C)  TempSrc: Oral  SpO2: 96%    General appearance: ALERT; in no acute distress.  Head: NCAT Lungs: Normal respiratory effort CV: Radial pulse 2+ Musculoskeletal: RT hand Inspection: Skin warm, dry, clear and intact without obvious  erythema, effusion, or ecchymosis.  Palpation: Nontender to palpation ROM: FROM active and passive Special test: Negative phalen's or tinels Skin: warm and dry Neurologic: Ambulates without difficulty Psychological: alert and cooperative; normal mood and affect  ASSESSMENT & PLAN:  1. Numbness and tingling in right hand   2. Carpal tunnel syndrome of right wrist     Meds ordered this encounter  Medications  . predniSONE (DELTASONE) 20 MG tablet    Sig: Take 1 tablet (20 mg total) by mouth 2 (two) times daily with a meal for 5 days.    Dispense:  10 tablet    Refill:  0    Order Specific Question:   Supervising Provider    Answer:   Eustace Moore [[id number]]    Continue conservative management of rest, ice, and elevation Wrist splint given.  Wear 24/7 except while showering Prednisone prescribed.  Take as directed and to completion Follow up with orthopedist if symptoms persists Return or go to the ER if you have any new or worsening symptoms (fever, chills, chest pain, redness, pain, worsening symptoms despite treatment, etc...)    Reviewed expectations re: course of current medical issues. Questions answered. Outlined signs and symptoms indicating need for more acute intervention. Patient verbalized understanding. After Visit Summary given.    Rennis Harding, PA-C 05/17/19 1435

## 2019-05-17 NOTE — Discharge Instructions (Signed)
Continue conservative management of rest, ice, and elevation Wrist splint given.  Wear 24/7 except while showering Prednisone prescribed.  Take as directed and to completion Follow up with orthopedist if symptoms persists Return or go to the ER if you have any new or worsening symptoms (fever, chills, chest pain, redness, pain, worsening symptoms despite treatment, etc...)

## 2019-05-17 NOTE — ED Triage Notes (Signed)
Noticed numbness in right middle, ring, and little finger. Numbness along lateral right hand.  Onset of symptoms 4-5 days ago.  Right hand is her dominant hand.    Denies any change in sensation in right arm above the hand.  Patient moves fingers, but feels middle, ring, little fingers are week, making gripping items difficult to do

## 2019-05-25 ENCOUNTER — Other Ambulatory Visit: Payer: Self-pay

## 2019-05-25 ENCOUNTER — Encounter: Payer: Self-pay | Admitting: Orthopaedic Surgery

## 2019-05-25 ENCOUNTER — Ambulatory Visit: Payer: 59 | Admitting: Orthopaedic Surgery

## 2019-05-25 VITALS — BP 149/82 | HR 95 | Ht 65.0 in | Wt 383.0 lb

## 2019-05-25 DIAGNOSIS — Z6841 Body Mass Index (BMI) 40.0 and over, adult: Secondary | ICD-10-CM

## 2019-05-25 DIAGNOSIS — G5621 Lesion of ulnar nerve, right upper limb: Secondary | ICD-10-CM

## 2019-05-25 NOTE — Addendum Note (Signed)
Addended by: Baird Kay on: 05/25/2019 11:47 AM   Modules accepted: Orders

## 2019-05-25 NOTE — Progress Notes (Signed)
Subjective:    Patient ID: Natalie Reynolds, female    DOB: [birth year], 34 y.o.   MRN: [id number]  HPI She began noticing pain and numbness of the ulnar most fingers about two weeks ago.  She noticed the little finger had almost no feeling and had tingling.  She then noticed the ring finger had problems.  She has had problems extending those fingers over the last week. She went to Urgent Care on 05-17-2019.  They were concerned about carpal tunnel and gave her a cock-up splint.  She has had more problems with the right dominant hand over the last week. She has problems grasping things.  She has no trauma, no redness, no swelling.  She has no elbow or wrist pain. She has no shoulder or neck pain.  Nothing seems to help.   Review of Systems  Constitutional: Positive for activity change.  Musculoskeletal: Positive for arthralgias.  All other systems reviewed and are negative.  For Review of Systems, all other systems reviewed and are negative.  The following is a summary of the past history medically, past history surgically, known current medicines, social history and family history.  This information is gathered electronically by the computer from prior information and documentation.  I review this each visit and have found including this information at this point in the chart is beneficial and informative.   Past Medical History:  Diagnosis Date  . Hypertension     History reviewed. No pertinent surgical history.  Current Outpatient Medications on File Prior to Visit  Medication Sig Dispense Refill  . albuterol (PROVENTIL) (2.5 MG/3ML) 0.083% nebulizer solution Take 3-6 mLs (2.5-5 mg total) by nebulization every 4 (four) hours as needed for wheezing or shortness of breath. 75 mL 12  . chlorthalidone (HYGROTON) 25 MG tablet Take by mouth.    . fluticasone (FLONASE) 50 MCG/ACT nasal spray Place into the nose.    . folic acid (FOLVITE) 989 MCG tablet Take by mouth.    Marland Kitchen ibuprofen  (ADVIL,MOTRIN) 600 MG tablet Take 1 tablet (600 mg total) by mouth every 6 (six) hours as needed. 30 tablet 0  . losartan (COZAAR) 50 MG tablet Take 25 mg by mouth daily.    . Potassium Gluconate 2.5 MEQ TABS Take by mouth.    . [DISCONTINUED] losartan-hydrochlorothiazide (HYZAAR) 50-12.5 MG tablet Take 1 tablet by mouth daily. 90 tablet 0   No current facility-administered medications on file prior to visit.    Social History   Socioeconomic History  . Marital status: Single    Spouse name: Not on file  . Number of children: Not on file  . Years of education: Not on file  . Highest education level: Not on file  Occupational History  . Not on file  Tobacco Use  . Smoking status: Never Smoker  . Smokeless tobacco: Never Used  Substance and Sexual Activity  . Alcohol use: No  . Drug use: No  . Sexual activity: Not on file  Other Topics Concern  . Not on file  Social History Narrative  . Not on file   Social Determinants of Health   Financial Resource Strain:   . Difficulty of Paying Living Expenses:   Food Insecurity:   . Worried About Charity fundraiser in the Last Year:   . Arboriculturist in the Last Year:   Transportation Needs:   . Film/video editor (Medical):   Marland Kitchen Lack of Transportation (Non-Medical):   Physical Activity:   .  Days of Exercise per Week:   . Minutes of Exercise per Session:   Stress:   . Feeling of Stress :   Social Connections:   . Frequency of Communication with Friends and Family:   . Frequency of Social Gatherings with Friends and Family:   . Attends Religious Services:   . Active Member of Clubs or Organizations:   . Attends Banker Meetings:   Marland Kitchen Marital Status:   Intimate Partner Violence:   . Fear of Current or Ex-Partner:   . Emotionally Abused:   Marland Kitchen Physically Abused:   . Sexually Abused:     History reviewed. No pertinent family history.  BP (!) 149/82   Pulse 95   Ht 5\' 5"  (1.651 m)   Wt (!) 383 lb (173.7  kg)   LMP 05/10/2019   BMI 63.73 kg/m   Body mass index is 63.73 kg/m.  The patient meets the AMA guidelines for Morbid (severe) obesity with a BMI > 40.0 and I have recommended weight loss.       Objective:   Physical Exam Vitals and nursing note reviewed.  Constitutional:      Appearance: She is well-developed.  HENT:     Head: Normocephalic and atraumatic.  Eyes:     Conjunctiva/sclera: Conjunctivae normal.     Pupils: Pupils are equal, round, and reactive to light.  Cardiovascular:     Rate and Rhythm: Normal rate and regular rhythm.  Pulmonary:     Effort: Pulmonary effort is normal.  Abdominal:     Palpations: Abdomen is soft.  Musculoskeletal:       Hands:     Cervical back: Normal range of motion and neck supple.  Skin:    General: Skin is warm and dry.  Neurological:     Mental Status: She is alert and oriented to person, place, and time.     Cranial Nerves: No cranial nerve deficit.     Motor: No abnormal muscle tone.     Coordination: Coordination normal.     Deep Tendon Reflexes: Reflexes are normal and symmetric. Reflexes normal.  Psychiatric:        Behavior: Behavior normal.        Thought Content: Thought content normal.        Judgment: Judgment normal.      I have reviewed the notes from Urgent Care.     Assessment & Plan:   Encounter Diagnoses  Name Primary?  . 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 Body mass index 60.0-69.9, adult (HCC)   . Morbid obesity (HCC)    I have explained about ulnar nerve pathology.  I would like to get EMGs.  She works at [employer] and the EMG lab is next to where she works.  She will try to get it done there.  She may need surgery release of ulnar nerve depending on findings of EMGs.  I told her to [employer] about this and read up on it.  Return in two weeks.  Call if any problem.  Precautions discussed.   Electronically Signed [employer], MD 5/18/202110:39 AM

## 2019-05-27 ENCOUNTER — Ambulatory Visit: Payer: Managed Care, Other (non HMO) | Admitting: Orthopedic Surgery

## 2019-06-01 ENCOUNTER — Ambulatory Visit: Payer: 59 | Admitting: Orthopaedic Surgery

## 2019-06-01 ENCOUNTER — Other Ambulatory Visit: Payer: Self-pay

## 2019-06-01 ENCOUNTER — Ambulatory Visit (INDEPENDENT_AMBULATORY_CARE_PROVIDER_SITE_OTHER): Payer: Managed Care, Other (non HMO) | Admitting: Orthopedic Surgery

## 2019-06-01 ENCOUNTER — Encounter: Payer: Self-pay | Admitting: Orthopedic Surgery

## 2019-06-01 VITALS — BP 175/93 | HR 94 | Ht 65.0 in | Wt 384.0 lb

## 2019-06-01 DIAGNOSIS — G5601 Carpal tunnel syndrome, right upper limb: Secondary | ICD-10-CM | POA: Diagnosis not present

## 2019-06-01 DIAGNOSIS — R94131 Abnormal electromyogram [EMG]: Secondary | ICD-10-CM | POA: Diagnosis not present

## 2019-06-01 DIAGNOSIS — R609 Edema, unspecified: Secondary | ICD-10-CM | POA: Insufficient documentation

## 2019-06-01 DIAGNOSIS — G5621 Lesion of ulnar nerve, right upper limb: Secondary | ICD-10-CM | POA: Diagnosis not present

## 2019-06-01 DIAGNOSIS — J45909 Unspecified asthma, uncomplicated: Secondary | ICD-10-CM | POA: Insufficient documentation

## 2019-06-01 NOTE — Progress Notes (Signed)
[place]  Chief Complaint  Patient presents with  . Carpal Tunnel    right     34 year old right-hand-dominant had an abnormal nerve study and findings somewhat consistent with carpal tunnel syndrome and ulnar nerve palsy  Reports she woke up one morning with the hand showing the small finger and ring finger with flexion deformities unable to extend  Denies any pain reports decreased sensation in the small and ring finger.  She says she has had carpal tunnel in the past this is a different syndrome of complaints     Review of Systems  Constitutional: Negative for chills and fever.  Musculoskeletal: Positive for neck pain.  Skin: Negative.   Neurological: Positive for headaches.     Past Medical History:  Diagnosis Date  . Hypertension     History reviewed. No pertinent surgical history.  Family History  Problem Relation Age of Onset  . Congestive Heart Failure Mother    Social History   Tobacco Use  . Smoking status: Never Smoker  . Smokeless tobacco: Never Used  Substance Use Topics  . Alcohol use: No  . Drug use: No    Allergies  Allergen Reactions  . Hydrochlorothiazide     Other reaction(s): Headache     Current Meds  Medication Sig  . albuterol (PROVENTIL) (2.5 MG/3ML) 0.083% nebulizer solution Take 3-6 mLs (2.5-5 mg total) by nebulization every 4 (four) hours as needed for wheezing or shortness of breath.  . chlorthalidone (HYGROTON) 25 MG tablet Take by mouth.  . fluticasone (FLONASE) 50 MCG/ACT nasal spray Place into the nose.  . folic acid (FOLVITE) 195 MCG tablet Take by mouth.  Marland Kitchen ibuprofen (ADVIL,MOTRIN) 600 MG tablet Take 1 tablet (600 mg total) by mouth every 6 (six) hours as needed.  Marland Kitchen losartan (COZAAR) 50 MG tablet Take 25 mg by mouth daily.  . Potassium Gluconate 2.5 MEQ TABS Take by mouth.    BP (!) 175/93   Pulse 94   Ht 5\' 5"  (1.651 m)   Wt (!) 384 lb (174.2 kg)   LMP 05/10/2019   BMI 63.90 kg/m   Physical  Exam Constitutional:      General: She is not in acute distress.    Appearance: Normal appearance. She is well-developed. She is obese.  Cardiovascular:     Comments: No peripheral edema Skin:    General: Skin is warm and dry.  Neurological:     Mental Status: She is alert and oriented to person, place, and time.     Sensory: No sensory deficit.     Coordination: Coordination normal.     Gait: Gait normal.     Deep Tendon Reflexes: Reflexes are normal and symmetric.     Ortho Exam   Patient's left hand is completely normal in appearance nerve function vascular function strength and skin  The right hand the small finger and ring finger are flexed the long finger is extended the index finger is also flexed.  They are passively correctable to neutral without pain but active motion is abnormal the thumb range of motion is normal  The patient can make a full closed hand but on opening the hand the deformities noted.  No pain at the elbow normal sensation up to the wrist where from the wrist distally she has numbness and tingling ulnar side only median nerve side normal sensation color capillary refill again normal    MEDICAL DECISION SECTION  Nerve conduction study was done at [facility] by Dr. Lattie Haw  Garen Lah and Dr. Rozann Lesches  Nerve study findings are in the media section  Unclear etiology acute onset of numbness from the wrist to the fingertips in the right ring finger small finger with flexion deformities of both digits and the right index finger with no history of pain paresthesias or trauma prior to this.  Patient says she woke up with these findings  Hemoglobin A1c was 6 ferritin level was low  Encounter Diagnoses  Name Primary?  . Lesion of right ulnar nerve   . Carpal tunnel syndrome of right wrist Yes  . Abnormal EMG      PLAN:   Primary care doctor: at [facility]   This is an abnormality that I am not accustomed to.  It appears that she has an ulnar nerve palsy of acute  onset similar to the Saturday night palsy of the radial nerve.  However, all 3 nerves are involved.  She appears to have of carpal tunnel syndrome without carpal tunnel symptoms on EMG and nerve conduction study    Hand surgery referral at [facility]   Functional hand splint until diagnosis     Unclear etiology recommend hand surgery referral.  Not clear as this is not a routine carpal tunnel situation. No orders of the defined types were placed in this encounter.   Fuller Canada, MD 06/01/2019 3:28 PM

## 2019-06-01 NOTE — Patient Instructions (Signed)
We are unsure of the diagnosis    Functional hand splint  Consult Hand at [facility]

## 2019-06-08 ENCOUNTER — Other Ambulatory Visit: Payer: Self-pay

## 2019-06-08 ENCOUNTER — Encounter (HOSPITAL_COMMUNITY): Payer: Self-pay | Admitting: Specialist

## 2019-06-08 ENCOUNTER — Ambulatory Visit (HOSPITAL_COMMUNITY): Payer: 59 | Attending: Orthopedic Surgery | Admitting: Specialist

## 2019-06-08 DIAGNOSIS — R29898 Other symptoms and signs involving the musculoskeletal system: Secondary | ICD-10-CM | POA: Insufficient documentation

## 2019-06-08 DIAGNOSIS — R29818 Other symptoms and signs involving the nervous system: Secondary | ICD-10-CM | POA: Insufficient documentation

## 2019-06-08 NOTE — Patient Instructions (Signed)
Home Exercises Program Theraputty Exercises  Do the following exercises 2 times a day using your affected hand.  1. Roll putty into a ball.  2. Make into a pancake.  3. Roll putty into a roll.  4. Pinch along log with first finger and thumb.   5. Make into a ball.  6. Roll it back into a log.   7. Pinch using thumb and side of first finger.  8. Roll into a ball, then flatten into a pancake.  9. Using your fingers, make putty into a mountain.  10. Roll a marble through the putty, pushing it with your ring, small, and index finger.  Splint Instructions  Splint Wearing Schedule: Apply finger splints to right hand index, ring, and small fingers. Splint should be worn for daily as tolerated. Remove splint periodically throughout the day and make a fist. You may sleep in your splints.   CAUTION: Check skin for pressure marks after splint is removed.  Copyright  VHI. All rights reserved.

## 2019-06-08 NOTE — Therapy (Signed)
[facility] [address] [place], [place], [address] Phone: [phone]   Fax:  [phone]  Occupational Therapy Evaluation  Patient Details  Name: Panagiota Perfetti MRN: [id number] Date of Birth: 01/15/1985 Referring Provider (OT): Dr. Fuller Canada   Encounter Date: 06/08/2019  OT End of Session - 06/08/19 2201    Visit Number  1    Number of Visits  8    Date for OT Re-Evaluation  07/06/19    Authorization - Visit Number  0    Authorization - Number of Visits  40    OT Start Time  1300    OT Stop Time  1340    OT Time Calculation (min)  40 min    Activity Tolerance  Patient tolerated treatment well    Behavior During Therapy  [facility] for tasks assessed/performed       Past Medical History:  Diagnosis Date  . Hypertension     History reviewed. No pertinent surgical history.  There were no vitals filed for this visit.  Subjective Assessment - 06/08/19 2157    Subjective   S:  I woke up on May 3rd and my hand was just like this.    Pertinent History  Ms. Santaella reports that on the morning of May 3 she awoke with numbness in her right index, ring, and small digits, and was unable to fully exend her digits.  Patient consulted with Dr. Romeo Apple and has been referred to occupational therapy for eval and treatment and splinting.    Patient Stated Goals  I want to be able to straighten my fingers out.    Currently in Pain?  No/denies        [facility] OT Assessment - 06/08/19 0001      Assessment   Medical Diagnosis  Right Hand Nerve Palsy    Referring Provider (OT)  Dr. Fuller Canada    Onset Date/Surgical Date  05/10/19    Hand Dominance  Right    Next MD Visit  06/10/19 with hand md at [facility]    Prior Therapy  n/a      Precautions   Precautions  None      Restrictions   Weight Bearing Restrictions  No      Balance Screen   Has the patient fallen in the past 6 months  No    Has the patient had a decrease in activity level  because of a fear of falling?   No    Is the patient reluctant to leave their home because of a fear of falling?   No      Home  Environment   Family/patient expects to be discharged to:  Private residence      ADL   ADL comments  difficulty using dominant right hand with functional tasks due to decreased extension and weakness      Written Expression   Dominant Hand  Right      Vision - History   Baseline Vision  No visual deficits      Cognition   Overall Cognitive Status  Within Functional Limits for tasks assessed      Observation/Other Assessments   Focus on Therapeutic Outcomes (FOTO)   see grip strength assessment      Sensation   Light Touch  Appears Intact    Additional Comments  2 point sensation is intact.  Patient reports pins and needles sensation in her index, ring, and small digit  Coordination   Fine Motor Movements are Fluid and Coordinated  Yes      ROM / Strength   AROM / PROM / Strength  PROM;AROM      AROM   Overall AROM Comments  digit fexion a/rom is WFL, extension is WFL except for what is noted below.        PROM   Overall PROM Comments  digits can be fully flexed and extended passively      Right Hand AROM   R Index  MCP 0-90  20 Degrees    R Index PIP 0-100  20 Degrees    R Index DIP 0-70  0 Degrees    R Ring  MCP 0-90  -20 Degrees    R Ring PIP 0-100  30 Degrees    R Ring DIP 0-70  10 Degrees    R Little  MCP 0-90  -20 Degrees    R Little PIP 0-100  30 Degrees    R Little DIP 0-70  0 Degrees      Hand Function   Right Hand Grip (lbs)  12    Right Hand 3 Point Pinch  2 lbs   ring, little,thumb   Left Hand Grip (lbs)  25    Left 3 point pinch  8 lbs               OT Treatments/Exercises (OP) - 06/08/19 0001      Splinting   Splinting  fit patient with LMB splint for index and ring digit, allowing full digit extension at each joint, as well as a figure 8 splint for small digit allowing for full extension at each joint.   educated on wear and care of splint, contrainications.  paitent to wear splint during day, removing for exercise every few hours, and wear at night as tolerated.             OT Education - 06/08/19 2200    Education Details  reviewed donning and doffing of LMB and figure 8 splints, as well as caring for splints and contraindications.    Person(s) Educated  Patient    Methods  Explanation;Handout    Comprehension  Verbalized understanding;Returned demonstration       OT Short Term Goals - 06/08/19 2210      OT SHORT TERM GOAL #1   Title  Patient will be educated and independent with HEP for improved functional use of right hand with daily tasks.    Time  4    Period  Weeks    Status  New    Target Date  07/06/19      OT SHORT TERM GOAL #2   Title  Patient will improve a/rom of right hand digit extension to [facility] in order to weightbear and actively release items she is holding.    Time  4    Period  Weeks    Status  New      OT SHORT TERM GOAL #3   Title  Patient will improve right hand grip strength to 25# or more and pinch strength to 6 # or more for greater independence with grasping utensils and other household items.    Time  4    Status  New      OT SHORT TERM GOAL #4   Title  Patient will have WNL sensation in right hand for greater safety with ADL completion.    Time  4    Period  Weeks  Status  New      OT SHORT TERM GOAL #5   Title  Patient will be independent with wear and care of splints for improved digit extension.    Time  4    Period  Weeks    Status  New               Plan - 06/08/19 2203    Clinical Impression Statement  A:  patient is a 34 year old female who reports awaking on 05/10/19 with numbness in her index, ring, and small digits with fingers flexed and unable to fully extend her digits actively.  Patient is right hand dominant, and she is unable to complete ADLs, work, and leisure actiivites as she normally would due to limitations in  extension of her digits.    OT Occupational Profile and History  Problem Focused Assessment - Including review of records relating to presenting problem    Occupational performance deficits (Please refer to evaluation for details):  ADL's;IADL's;Work;Leisure    Body Structure / Function / Physical Skills  ADL;FMC;Dexterity;ROM;Strength;Sensation;UE functional use    Rehab Potential  Excellent    Clinical Decision Making  Limited treatment options, no task modification necessary    Comorbidities Affecting Occupational Performance:  None    Modification or Assistance to Complete Evaluation   Min-Moderate modification of tasks or assist with assess necessary to complete eval    OT Frequency  2x / week    OT Duration  4 weeks    OT Treatment/Interventions  Self-care/ADL training;Therapeutic exercise;DME and/or AE instruction;Splinting;Manual Therapy;Neuromuscular education;Electrical Stimulation;Moist Heat;Energy conservation;Therapeutic activities;Patient/family education;Passive range of motion    Plan  P:  Skilled OT intervention to improve sensation, a/rom of digits and right hand strength in order to return to prior level of independnece with all b/iadls, work, and leisure activities.  Next session:  follow up on splint wear and care.  begin grip strengthening and therapeutic actiivties to improve digit extension and sensation.    OT Home Exercise Plan  splint wear and care and theraputty for grip strengthening.    Consulted and Agree with Plan of Care  Patient       Patient will benefit from skilled therapeutic intervention in order to improve the following deficits and impairments:   Body Structure / Function / Physical Skills: ADL, FMC, Dexterity, ROM, Strength, Sensation, UE functional use       Visit Diagnosis: Other symptoms and signs involving the nervous system - Plan: Ot plan of care cert/re-cert  Other symptoms and signs involving the musculoskeletal system - Plan: Ot plan of  care cert/re-cert    Problem List Patient Active Problem List   Diagnosis Date Noted  . Asthma 06/01/2019  . Edema 06/01/2019  . DOE (dyspnea on exertion) 03/12/2019  . Mild intermittent asthma without complication 85/46/2703  . Essential hypertension 05/14/2018  . Leg swelling 05/14/2018  . Morbid obesity with BMI of 60.0-69.9, adult [facility]) 03/05/2017    Vangie Bicker, [place], OTR/L [phone]  06/08/2019, 10:31 PM  [place] [address] [place], [place], [address] Phone: [phone]   Fax:  [phone]  Name: Syreeta Figler MRN: [id number] Date of Birth: 1985/11/22

## 2019-06-15 ENCOUNTER — Ambulatory Visit: Payer: 59 | Admitting: Orthopaedic Surgery

## 2019-06-15 ENCOUNTER — Ambulatory Visit (HOSPITAL_COMMUNITY): Payer: 59 | Admitting: Occupational Therapy

## 2019-06-15 ENCOUNTER — Encounter (HOSPITAL_COMMUNITY): Payer: Self-pay | Admitting: Occupational Therapy

## 2019-06-15 ENCOUNTER — Other Ambulatory Visit: Payer: Self-pay

## 2019-06-15 DIAGNOSIS — R29818 Other symptoms and signs involving the nervous system: Secondary | ICD-10-CM | POA: Diagnosis not present

## 2019-06-15 DIAGNOSIS — R29898 Other symptoms and signs involving the musculoskeletal system: Secondary | ICD-10-CM

## 2019-06-15 NOTE — Therapy (Signed)
[place] [address], [place], [address] Phone: [phone]   Fax:  [phone]  Occupational Therapy Treatment  Patient Details  Name: Natalie Reynolds MRN: [id number] Date of Birth: 1985-09-06 Referring Provider (OT): Dr. Arther Abbott   Encounter Date: 06/15/2019  OT End of Session - 06/15/19 1817    Visit Number  2    Number of Visits  8    Date for OT Re-Evaluation  07/06/19    Authorization - Visit Number  1    Authorization - Number of Visits  40    OT Start Time  9323    OT Stop Time  5573    OT Time Calculation (min)  42 min    Activity Tolerance  Patient tolerated treatment well    Behavior During Therapy  [facility] for tasks assessed/performed       Past Medical History:  Diagnosis Date  . Hypertension     History reviewed. No pertinent surgical history.  There were no vitals filed for this visit.  Subjective Assessment - 06/15/19 1743    Subjective   S: "Its numb off her by my pinky and ring finger."    Pertinent History  Natalie Reynolds reports that on the morning of May 3 she awoke with numbness in her right index, ring, and small digits, and was unable to fully exend her digits.  Patient consulted with Dr. Aline Brochure and has been referred to occupational therapy for eval and treatment and splinting.    Currently in Pain?  No/denies                   OT Treatments/Exercises (OP) - 06/15/19 1744      Exercises   Exercises  Theraputty;Hand      Hand Exercises   Hand Gripper with Medium Beads  10 beads. 11# resistance.2 sets    Other Hand Exercises  Rolling hand for finger stretch on small PVC. 10x    Other Hand Exercises  Pt pinching green clothe pins and placing on foam board with thumb, index, and middle dinger 15x. Pt pinching yellow clothe pin with thumb, ringer, and pinky 15x. Ulnar palmer grasp of yellow clothe pins 10x.      Theraputty   Theraputty - Flatten  x5    Theraputty - Roll  x5    Theraputty - Pinch  Pinch down "worm" with each finger and thumb. ~15 times each    Theraputty Hand- Locate Pegs  Hiding and locating dominoes in putty. First one, then two, then four.               OT Short Term Goals - 06/08/19 2210      OT SHORT TERM GOAL #1   Title  Patient will be educated and independent with HEP for improved functional use of right hand with daily tasks.    Time  4    Period  Weeks    Status  New    Target Date  07/06/19      OT SHORT TERM GOAL #2   Title  Patient will improve a/rom of right hand digit extension to [facility] in order to weightbear and actively release items she is holding.    Time  4    Period  Weeks    Status  New      OT SHORT TERM GOAL #3   Title  Patient will improve right hand grip strength to 25# or more and pinch strength  to 6 # or more for greater independence with grasping utensils and other household items.    Time  4    Status  New      OT SHORT TERM GOAL #4   Title  Patient will have WNL sensation in right hand for greater safety with ADL completion.    Time  4    Period  Weeks    Status  New      OT SHORT TERM GOAL #5   Title  Patient will be independent with wear and care of splints for improved digit extension.    Time  4    Period  Weeks    Status  New               Plan - 06/15/19 1818    Clinical Impression Statement  A: Pt very motivated to particiapte in therapy. Pt reporting that she has been wearing splints daily; however, splint for index finer became very tight and splint for pinky keeps falling off. Also reports that MD ordered a new splint at visit. Pt demonstrating understanding of education provided at last session with theraputty. Pt performing theraputty exercises with LUE; continues to demonstrate weakness and poor finger extension. Added rolling putty and then pinching with each finger for strengthing. Focused on pinch and grip strengthing with clothe pin task and bead pick up. Providing cues on  form, positioning, and relaxation techniques for cramps.    Body Structure / Function / Physical Skills  ADL;FMC;Dexterity;ROM;Strength;Sensation;UE functional use    Plan  P: Continue strengthing and digit extension exercises. Follow up about splint from MD.       Patient will benefit from skilled therapeutic intervention in order to improve the following deficits and impairments:   Body Structure / Function / Physical Skills: ADL, FMC, Dexterity, ROM, Strength, Sensation, UE functional use       Visit Diagnosis: Other symptoms and signs involving the musculoskeletal system  Other symptoms and signs involving the nervous system    Problem List Patient Active Problem List   Diagnosis Date Noted  . Asthma 06/01/2019  . Edema 06/01/2019  . DOE (dyspnea on exertion) 03/12/2019  . Mild intermittent asthma without complication 02/18/2019  . Essential hypertension 05/14/2018  . Leg swelling 05/14/2018  . Morbid obesity with BMI of 60.0-69.9, adult [facility]) 03/05/2017    Gabriel Rung, MSOT, OTR/L 06/15/2019, 6:28 PM  [facility] [address] [place], [place], [address] Phone: [phone]   Fax:  [phone]  Name: Natalie Reynolds MRN: [id number] Date of Birth: [birth year]

## 2019-06-17 ENCOUNTER — Telehealth (HOSPITAL_COMMUNITY): Payer: Self-pay | Admitting: Occupational Therapy

## 2019-06-17 NOTE — Telephone Encounter (Signed)
pt cancelled the remainder of her appts, she will have the MD put in a new referral after her surgery

## 2019-06-18 ENCOUNTER — Encounter (HOSPITAL_COMMUNITY): Payer: 59 | Admitting: Occupational Therapy

## 2019-06-22 ENCOUNTER — Encounter (HOSPITAL_COMMUNITY): Payer: 59 | Admitting: Occupational Therapy

## 2019-06-25 ENCOUNTER — Encounter (HOSPITAL_COMMUNITY): Payer: 59 | Admitting: Occupational Therapy

## 2019-06-25 ENCOUNTER — Ambulatory Visit (HOSPITAL_COMMUNITY): Payer: Managed Care, Other (non HMO) | Admitting: Occupational Therapy

## 2019-07-02 ENCOUNTER — Encounter (HOSPITAL_COMMUNITY): Payer: 59 | Admitting: Occupational Therapy

## 2019-07-05 ENCOUNTER — Encounter (HOSPITAL_COMMUNITY): Payer: 59 | Admitting: Occupational Therapy

## 2019-07-09 ENCOUNTER — Encounter (HOSPITAL_COMMUNITY): Payer: 59 | Admitting: Occupational Therapy

## 2019-07-12 IMAGING — DX CHEST - 2 VIEW
2 series · 2 of 2 positions shown · non-contrast
Comparison: PA and lateral chest 06/03/2017.

CLINICAL DATA: Cough and congestion for 1 week.

EXAM:
CHEST - 2 VIEW

[chest pa]
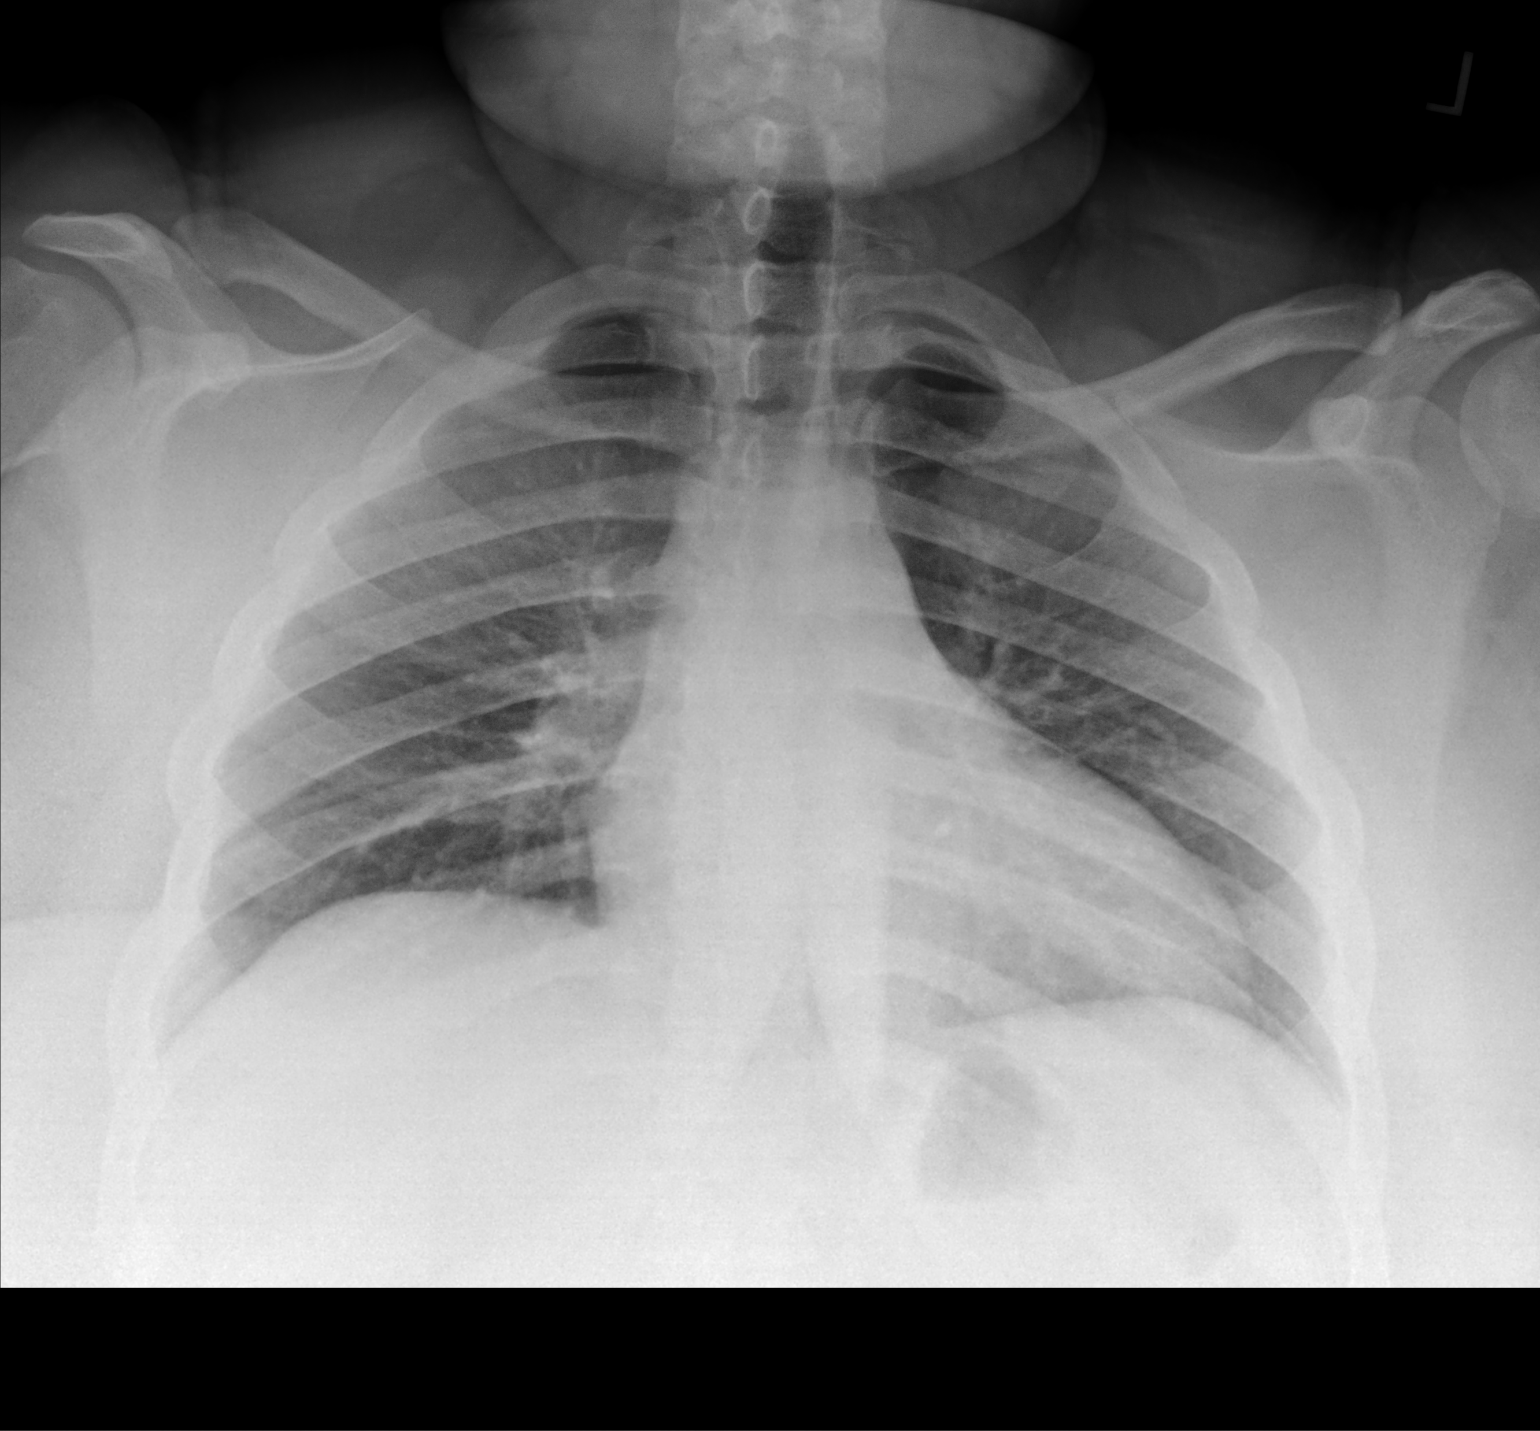

[chest lat]
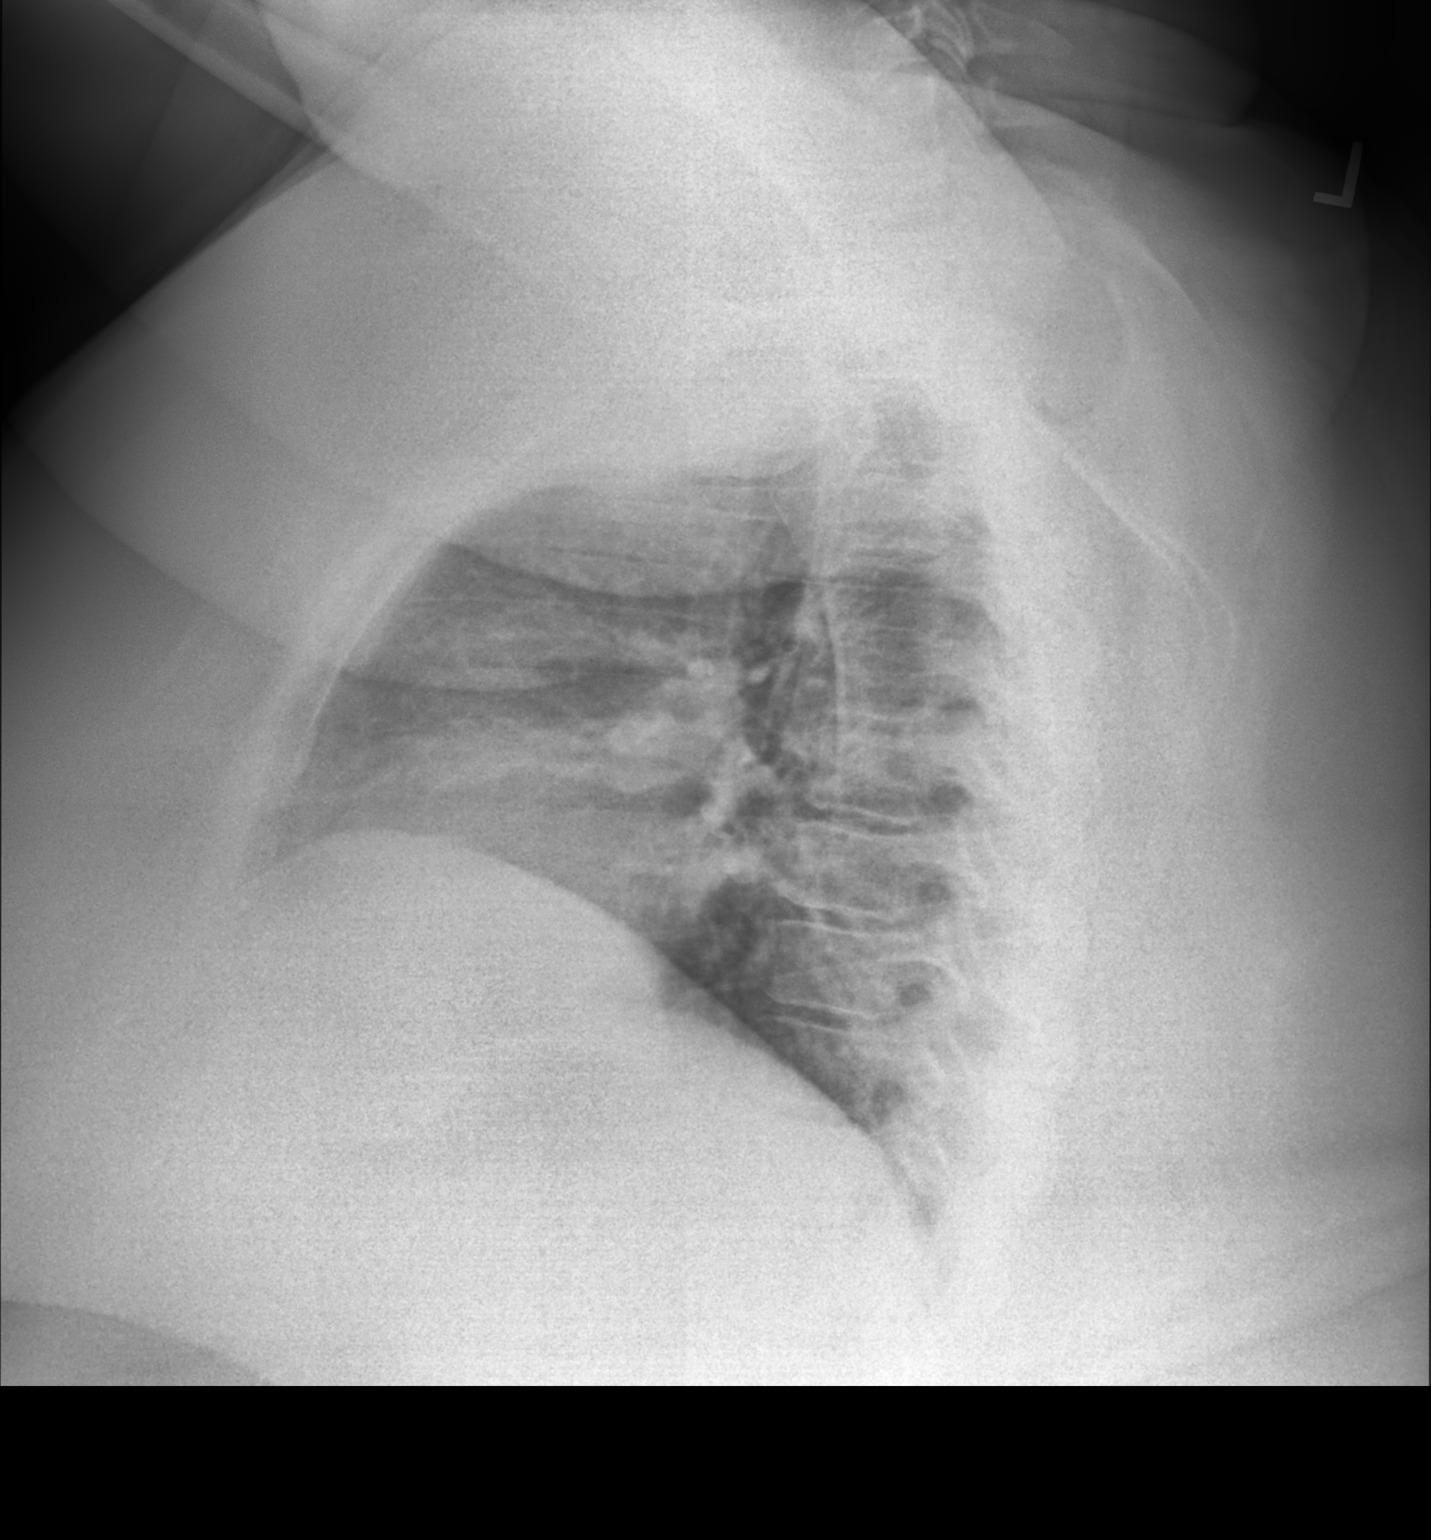

[2 of 2 positions shown; findings below may reference images not displayed]

FINDINGS: The lungs are clear. Heart size normal. No pneumothorax or pleural
effusion. No bony abnormality.
IMPRESSION: Negative chest.

## 2019-12-06 ENCOUNTER — Emergency Department (HOSPITAL_COMMUNITY)
Admission: EM | Admit: 2019-12-06 | Discharge: 2019-12-06 | Disposition: A | Discharge status: Home / Self Care | Payer: 59 | Attending: Emergency Medicine | Admitting: Emergency Medicine

## 2019-12-06 ENCOUNTER — Encounter (HOSPITAL_COMMUNITY): Payer: Self-pay | Admitting: Emergency Medicine

## 2019-12-06 ENCOUNTER — Other Ambulatory Visit: Payer: Self-pay

## 2019-12-06 DIAGNOSIS — W1802XD Striking against glass with subsequent fall, subsequent encounter: Secondary | ICD-10-CM | POA: Diagnosis not present

## 2019-12-06 DIAGNOSIS — S81812D Laceration without foreign body, left lower leg, subsequent encounter: Secondary | ICD-10-CM | POA: Insufficient documentation

## 2019-12-06 DIAGNOSIS — Z5321 Procedure and treatment not carried out due to patient leaving prior to being seen by health care provider: Secondary | ICD-10-CM | POA: Diagnosis not present

## 2019-12-06 NOTE — ED Triage Notes (Signed)
Pt states she had a left leg laceration yesterday she was check on a [place] ED and they just clean it and bandage the laceration, but didn't do nothing to close the laceration. Pt fell on glasses.

## 2019-12-06 NOTE — ED Notes (Signed)
Registration gave this NT stickers for this patient and states "patient said she was leaving."

## 2022-03-07 ENCOUNTER — Encounter: Payer: Self-pay | Admitting: Radiology

## 2022-05-25 ENCOUNTER — Encounter (HOSPITAL_COMMUNITY): Payer: Self-pay | Admitting: Emergency Medicine

## 2022-05-25 ENCOUNTER — Emergency Department (HOSPITAL_COMMUNITY)
Admission: EM | Admit: 2022-05-25 | Discharge: 2022-05-25 | Disposition: A | Discharge status: Home / Self Care | Payer: 59 | Attending: Emergency Medicine | Admitting: Emergency Medicine

## 2022-05-25 ENCOUNTER — Emergency Department (HOSPITAL_COMMUNITY): Payer: 59

## 2022-05-25 ENCOUNTER — Other Ambulatory Visit: Payer: Self-pay

## 2022-05-25 DIAGNOSIS — M25531 Pain in right wrist: Secondary | ICD-10-CM | POA: Diagnosis present

## 2022-05-25 MED ORDER — METHOCARBAMOL 500 MG PO TABS: 500.0000 mg | ORAL_TABLET | Freq: Two times a day (BID) | ORAL | 0 refills | Status: AC

## 2022-05-25 NOTE — ED Provider Notes (Signed)
[place] EMERGENCY DEPARTMENT AT [facility] Provider Note   CSN: [id number] Arrival date & time: 05/25/22  1919    History  Chief Complaint  Patient presents with   Wrist Pain    Natalie Reynolds is a 37 y.o. female with right wrist pain that began this morning and worsened throughout the day. She reports tenderness to the radial aspect of the volar surface of right wrist. Denies numbness, tingling, or decreased range of motion. Denies any falls on her outstretched hand or fever. No other complaints or concerns.     Home Medications Prior to Admission medications   Medication Sig Start Date End Date Taking? Authorizing Provider  methocarbamol (ROBAXIN) 500 MG tablet Take 1 tablet (500 mg total) by mouth 2 (two) times daily for 10 days. 05/25/22 06/04/22 Yes Maxwell Marion, PA-C  albuterol (PROVENTIL) (2.5 MG/3ML) 0.083% nebulizer solution Take 3-6 mLs (2.5-5 mg total) by nebulization every 4 (four) hours as needed for wheezing or shortness of breath. 08/12/15   Muthersbaugh, Dahlia Client, PA-C  chlorthalidone (HYGROTON) 25 MG tablet Take by mouth. 04/16/19 04/15/20  [provider]  fluticasone (FLONASE) 50 MCG/ACT nasal spray Place into the nose. 05/04/19 05/03/20  [provider]  folic acid (FOLVITE) 400 MCG tablet Take by mouth. 03/01/19   [provider]  ibuprofen (ADVIL,MOTRIN) 600 MG tablet Take 1 tablet (600 mg total) by mouth every 6 (six) hours as needed. 04/29/16   Audry Pili, PA-C  losartan (COZAAR) 50 MG tablet Take 25 mg by mouth daily. 06/11/18 06/11/19  [provider]  Potassium Gluconate 2.5 MEQ TABS Take by mouth.    [provider]  losartan-hydrochlorothiazide (HYZAAR) 50-12.5 MG tablet Take 1 tablet by mouth daily. 04/07/16 07/02/18  Mancel Bale, MD      Allergies    Hydrochlorothiazide    Review of Systems   Review of Systems  Musculoskeletal:        Right wrist pain  All other systems reviewed and are  negative.   Physical Exam Updated Vital Signs BP (!) 157/94 (BP Location: Left Wrist)   Pulse 80   Temp 98.5 F (36.9 C) (Oral)   Resp 19   Ht 5\' 5"  (1.651 m)   Wt (!) 168.3 kg   LMP 04/27/2022 (Exact Date)   SpO2 100%   BMI 61.74 kg/m  Physical Exam Vitals and nursing note reviewed.  Constitutional:      Appearance: Normal appearance.  HENT:     Head: Normocephalic and atraumatic.     Mouth/Throat:     Mouth: Mucous membranes are moist.  Eyes:     Conjunctiva/sclera: Conjunctivae normal.     Pupils: Pupils are equal, round, and reactive to light.  Cardiovascular:     Rate and Rhythm: Normal rate and regular rhythm.     Pulses: Normal pulses.  Pulmonary:     Effort: Pulmonary effort is normal.     Breath sounds: Normal breath sounds.  Abdominal:     Palpations: Abdomen is soft.     Tenderness: There is no abdominal tenderness.  Musculoskeletal:        General: Tenderness present.     Comments: Tenderness to the volar aspect of the right wrist on the radial side  Skin:    General: Skin is warm and dry.     Findings: No rash.  Neurological:     General: No focal deficit present.     Mental Status: She is alert.  Psychiatric:  Mood and Affect: Mood normal.        Behavior: Behavior normal.     ED Results / Procedures / Treatments   Labs (all labs ordered are listed, but only abnormal results are displayed) Labs Reviewed - No data to display  EKG None  Radiology DG Wrist Complete Right  Result Date: 05/25/2022 CLINICAL DATA:  Atraumatic right wrist pain. EXAM: RIGHT WRIST - COMPLETE 3+ VIEW COMPARISON:  None Available. FINDINGS: There is no evidence of an acute fracture or dislocation. There is no evidence of arthropathy or other focal bone abnormality. Soft tissues are unremarkable. IMPRESSION: Negative. Electronically Signed   By: Aram Candela M.D.   On: 05/25/2022 19:49    Procedures Procedures: not indicated.   Medications Ordered in  ED Medications - No data to display  ED Course/ Medical Decision Making/ A&P                             Medical Decision Making Amount and/or Complexity of Data Reviewed Radiology: ordered.   This patient presents to the ED for concern of right wrist pain, this involves an extensive number of treatment options, and is a complaint that carries with it a high risk of complications and morbidity.  The differential diagnosis includes scaphoid fracture, arthritis, gout, septic arthritis, inflammatory process.   Co morbidities that complicate the patient evaluation  History of ulnar nerve palsy   Additional history obtained:  Additional history obtained from patient External records from outside source obtained and reviewed including records from [facility] regarding previous neuroplasty.    Imaging Studies ordered:  I ordered imaging studies including right hand x-ray  I independently visualized and interpreted imaging which showed no acute fracture or dislocation. I agree with the radiologist interpretation   Problem List / ED Course / Critical interventions / Medication management  Pain to volar aspect to the radial portion of the right wrist I have reviewed the patients home medicines and have made adjustments as needed   Social Determinants of Health:  Physical activity   Test / Admission - Considered:  No acute findings on imaging. Patient stable and safe for discharge home. Return precautions given.         Final Clinical Impression(s) / ED Diagnoses Final diagnoses:  Acute pain of right wrist    Rx / DC Orders ED Discharge Orders          Ordered    methocarbamol (ROBAXIN) 500 MG tablet  2 times daily        05/25/22 2012              Maxwell Marion, PA-C 05/25/22 2040    Eber Hong, MD 05/26/22 1459

## 2022-05-25 NOTE — ED Triage Notes (Signed)
Pt with c/o Right wrist pain that started this morning. Denies any known injury.

## 2022-05-25 NOTE — Discharge Instructions (Addendum)
No fracture or dislocation indicated on right wrist x-ray. Patient given splint and anti-inflammatory medication.  Instructed to follow up with PCP in 1-2 weeks if pain persists for further.    Get help right away if: You lose feeling in your fingers or hand. Your fingers turn white, very red, or cold and blue. You cannot move your fingers.
